# Patient Record
Sex: Male | Born: 1970
Health system: Southern US, Community
[De-identification: ages and names within clinical notes are randomized; demographics above are authoritative.]

## PROBLEM LIST (undated history)

## (undated) DIAGNOSIS — J309 Allergic rhinitis, unspecified: Secondary | ICD-10-CM

## (undated) DIAGNOSIS — J45909 Unspecified asthma, uncomplicated: Secondary | ICD-10-CM

## (undated) DIAGNOSIS — W5501XA Bitten by cat, initial encounter: Secondary | ICD-10-CM

## (undated) HISTORY — DX: Bitten by cat, initial encounter: W55.01XA

## (undated) HISTORY — PX: OTHER SURGICAL HISTORY: SHX169

## (undated) HISTORY — DX: Unspecified asthma, uncomplicated: J45.909

## (undated) HISTORY — DX: Allergic rhinitis, unspecified: J30.9

---

## 2000-01-12 ENCOUNTER — Ambulatory Visit: Admission: RE | Admit: 2000-01-12 | Discharge: 2000-01-12 | Payer: Self-pay | Admitting: Internal Medicine

## 2002-04-23 ENCOUNTER — Encounter: Payer: Self-pay | Admitting: Family Medicine

## 2002-04-23 ENCOUNTER — Ambulatory Visit (HOSPITAL_COMMUNITY): Admission: RE | Admit: 2002-04-23 | Discharge: 2002-04-23 | Payer: Self-pay | Admitting: Family Medicine

## 2009-01-26 ENCOUNTER — Encounter: Admission: RE | Admit: 2009-01-26 | Discharge: 2009-01-26 | Payer: Self-pay | Admitting: Family Medicine

## 2013-04-25 ENCOUNTER — Other Ambulatory Visit: Payer: Self-pay | Admitting: Family Medicine

## 2013-04-25 DIAGNOSIS — R109 Unspecified abdominal pain: Secondary | ICD-10-CM

## 2013-04-30 ENCOUNTER — Ambulatory Visit
Admission: RE | Admit: 2013-04-30 | Discharge: 2013-04-30 | Disposition: A | Discharge status: Home / Self Care | Payer: BC Managed Care – PPO | Source: Ambulatory Visit | Attending: Family Medicine | Admitting: Family Medicine

## 2013-04-30 DIAGNOSIS — R109 Unspecified abdominal pain: Secondary | ICD-10-CM

## 2015-09-08 DIAGNOSIS — S80861A Insect bite (nonvenomous), right lower leg, initial encounter: Secondary | ICD-10-CM | POA: Diagnosis not present

## 2015-09-08 DIAGNOSIS — W57XXXA Bitten or stung by nonvenomous insect and other nonvenomous arthropods, initial encounter: Secondary | ICD-10-CM | POA: Diagnosis not present

## 2015-09-08 DIAGNOSIS — M791 Myalgia: Secondary | ICD-10-CM | POA: Diagnosis not present

## 2015-09-08 DIAGNOSIS — J309 Allergic rhinitis, unspecified: Secondary | ICD-10-CM | POA: Diagnosis not present

## 2015-09-08 DIAGNOSIS — J029 Acute pharyngitis, unspecified: Secondary | ICD-10-CM | POA: Diagnosis not present

## 2015-09-08 DIAGNOSIS — R5383 Other fatigue: Secondary | ICD-10-CM | POA: Diagnosis not present

## 2015-09-08 MED FILL — DOXYCYCLINE HYCLATE 100 MG: 100 | 10 days supply | Qty: 20 | Fill #0

## 2015-11-03 MED FILL — AZITHROMYCIN 250 MG TABLET: 250 | 5 days supply | Qty: 6 | Fill #0

## 2016-04-21 MED FILL — AMOXICILLIN 500 MG CAPSULE: 500 | 10 days supply | Qty: 40 | Fill #0

## 2017-04-03 ENCOUNTER — Other Ambulatory Visit: Payer: Self-pay | Admitting: Family Medicine

## 2017-04-03 ENCOUNTER — Ambulatory Visit
Admission: RE | Admit: 2017-04-03 | Discharge: 2017-04-03 | Disposition: A | Discharge status: Home / Self Care | Payer: 59 | Source: Ambulatory Visit | Attending: Family Medicine | Admitting: Family Medicine

## 2017-04-03 DIAGNOSIS — K5900 Constipation, unspecified: Secondary | ICD-10-CM | POA: Diagnosis not present

## 2017-04-03 DIAGNOSIS — M47816 Spondylosis without myelopathy or radiculopathy, lumbar region: Secondary | ICD-10-CM | POA: Diagnosis not present

## 2017-04-03 DIAGNOSIS — M545 Low back pain: Secondary | ICD-10-CM

## 2017-04-03 DIAGNOSIS — R109 Unspecified abdominal pain: Secondary | ICD-10-CM

## 2017-04-03 DIAGNOSIS — N5089 Other specified disorders of the male genital organs: Secondary | ICD-10-CM | POA: Diagnosis not present

## 2017-04-06 ENCOUNTER — Other Ambulatory Visit: Payer: Self-pay | Admitting: Family Medicine

## 2017-04-06 ENCOUNTER — Encounter: Payer: Self-pay | Admitting: Gastroenterology

## 2017-04-06 DIAGNOSIS — N5089 Other specified disorders of the male genital organs: Secondary | ICD-10-CM

## 2017-04-13 ENCOUNTER — Ambulatory Visit
Admission: RE | Admit: 2017-04-13 | Discharge: 2017-04-13 | Disposition: A | Discharge status: Home / Self Care | Payer: 59 | Source: Ambulatory Visit | Attending: Family Medicine | Admitting: Family Medicine

## 2017-04-13 DIAGNOSIS — N5089 Other specified disorders of the male genital organs: Secondary | ICD-10-CM | POA: Diagnosis not present

## 2017-04-19 ENCOUNTER — Encounter: Payer: Self-pay | Admitting: Gastroenterology

## 2017-04-19 ENCOUNTER — Ambulatory Visit: Payer: 59 | Admitting: Gastroenterology

## 2017-04-19 VITALS — BP 112/68 | HR 84 | Ht 76.0 in | Wt 205.2 lb

## 2017-04-19 DIAGNOSIS — R194 Change in bowel habit: Secondary | ICD-10-CM

## 2017-04-19 DIAGNOSIS — K219 Gastro-esophageal reflux disease without esophagitis: Secondary | ICD-10-CM

## 2017-04-19 DIAGNOSIS — R1030 Lower abdominal pain, unspecified: Secondary | ICD-10-CM

## 2017-04-19 DIAGNOSIS — R140 Abdominal distension (gaseous): Secondary | ICD-10-CM

## 2017-04-19 MED ORDER — NA SULFATE-K SULFATE-MG SULF 17.5-3.13-1.6 GM/177ML PO SOLN: 1.0000 | ORAL | 0 refills | Status: DC

## 2017-04-19 NOTE — Patient Instructions (Signed)
You have been scheduled for a colonoscopy. Please follow written instructions given to you at your visit today.  Please pick up your prep supplies at the pharmacy within the next 1-3 days. If you use inhalers (even only as needed), please bring them with you on the day of your procedure. Your physician has requested that you go to www.startemmi.com and enter the access code given to you at your visit today. This web site gives a general overview about your procedure. However, you should still follow specific instructions given to you by our office regarding your preparation for the procedure.  Daily powder fiber such as Citrucel or Benefiber.  Please purchase the following medications over the counter and take as directed: Zantac 150 mg daily.

## 2017-04-19 NOTE — Progress Notes (Signed)
04/19/2017 THEODUS RAN [id number] [birth year]   HISTORY OF PRESENT ILLNESS:  This is a pleasant 47 year old male who is new to our office.  Referred here by his PCP, Dr. Moreen Fowler, for evaluation regarding abdominal pain, bloating, and change in bowel habits.  He reports generalized/lower abdominal discomfort and distention/bloating that has been an issue for at least the past year.  Says that he used to have good bowel movements but now they are different sizes and shapes, sometimes narrow in caliber.  A lot of times does not feel like he completely evacuates.  Complains of low back pain.  Says that he constantly feels bloated or full.  Says that for some reason he seems to feel more bloated in the shower.  Denies weight loss.  Sees occasional small amounts of bright red blood on the toilet paper when he wipes, but no blood in the stool.  Was checked for celiac disease in 2013 and that was negative but he tells me that a couple of years ago he went gluten free.  About 3-4 months ago started eating some occasional gluten again.    He also complains of some reflux with irritation and sore throat.  Not on any medications for this issue.  Takes 3-4 Tums prn and that seems to help.  Since he has stopped drinking coffee he has not even really needed any Tums.  Recent CBC and CMP were normal.  Had a normal abdominal x-ray.   Past Medical History:  Diagnosis Date  . Allergic rhinitis   . Asthma   . Cat bite    History reviewed. No pertinent surgical history.  reports that  has never smoked. he has never used smokeless tobacco. He reports that he drinks alcohol. He reports that he does not use drugs. family history includes CVA in his maternal grandmother; Cancer in his paternal grandfather; Diabetes in his father and maternal grandmother; Hypertension in his father; Multiple myeloma in his father; Prostate cancer in his maternal grandfather. No Known Allergies    Outpatient Encounter  Medications as of 04/19/2017  Medication Sig  . albuterol (PROAIR HFA) 108 (90 Base) MCG/ACT inhaler Inhale 1-2 puffs into the lungs every 6 (six) hours as needed for wheezing or shortness of breath.  . calcium carbonate (TUMS - DOSED IN MG ELEMENTAL CALCIUM) 500 MG chewable tablet Chew 1 tablet by mouth daily.  . cetirizine (ZYRTEC) 5 MG tablet Take 5 mg by mouth daily.   No facility-administered encounter medications on file as of 04/19/2017.      REVIEW OF SYSTEMS  : All other systems reviewed and negative except where noted in the History of Present Illness.   PHYSICAL EXAM: BP 112/68   Pulse 84   Ht '6\' 4"'$  (1.93 m)   Wt 205 lb 3.2 oz (93.1 kg)   BMI 24.98 kg/m  General: Well developed white male in no acute distress Head: Normocephalic and atraumatic Eyes:  Sclerae anicteric, conjunctiva pink. Ears: Normal auditory acuity Lungs: Clear throughout to auscultation; no increased WOB. Heart: Regular rate and rhythm; no M/R/G. Abdomen: Soft, non-distended.  BS present.  Non-tender. Rectal:  Will be done at the time of colonoscopy. Musculoskeletal: Symmetrical with no gross deformities  Skin: No lesions on visible extremities Extremities: No edema  Neurological: Alert oriented x 4, grossly non-focal Psychological:  Alert and cooperative. Normal mood and affect  ASSESSMENT AND PLAN: *Change in bowel habits, bloating, lower abdominal pain:  Will schedule for colonoscopy with  Dr. Silverio Decamp.  Will have him begin taking a daily powder fiber supplement such as Benefiber or Citrucel. *GERD:  Not currently on any medication.  Will just start with zantac 150 mg daily.  **The risks, benefits, and alternatives to colonoscopy were discussed with the patient and he consents to proceed.    CC:  Donald Prose, MD CC:  Dr. Moreen Fowler

## 2017-04-23 ENCOUNTER — Encounter: Payer: Self-pay | Admitting: Gastroenterology

## 2017-04-24 DIAGNOSIS — K219 Gastro-esophageal reflux disease without esophagitis: Secondary | ICD-10-CM | POA: Insufficient documentation

## 2017-04-24 DIAGNOSIS — R1030 Lower abdominal pain, unspecified: Secondary | ICD-10-CM | POA: Insufficient documentation

## 2017-04-24 DIAGNOSIS — R140 Abdominal distension (gaseous): Secondary | ICD-10-CM | POA: Insufficient documentation

## 2017-04-24 DIAGNOSIS — R194 Change in bowel habit: Secondary | ICD-10-CM | POA: Insufficient documentation

## 2017-04-27 NOTE — Progress Notes (Signed)
Reviewed and agree with documentation and assessment and plan. K. Veena Nandigam , MD   

## 2017-05-21 ENCOUNTER — Encounter: Payer: Self-pay | Admitting: Gastroenterology

## 2017-05-22 MED FILL — SUPREP BOWEL PREP KIT: 17.5-3.13-1 | 1 days supply | Qty: 354 | Fill #0

## 2017-05-29 ENCOUNTER — Other Ambulatory Visit: Payer: Self-pay

## 2017-05-29 ENCOUNTER — Encounter: Payer: Self-pay | Admitting: Gastroenterology

## 2017-05-29 ENCOUNTER — Ambulatory Visit (AMBULATORY_SURGERY_CENTER): Payer: 59 | Admitting: Gastroenterology

## 2017-05-29 VITALS — BP 112/72 | HR 67 | Temp 98.8°F | Resp 27 | Ht 76.0 in | Wt 205.0 lb

## 2017-05-29 DIAGNOSIS — D128 Benign neoplasm of rectum: Secondary | ICD-10-CM

## 2017-05-29 DIAGNOSIS — R194 Change in bowel habit: Secondary | ICD-10-CM | POA: Diagnosis present

## 2017-05-29 DIAGNOSIS — K621 Rectal polyp: Secondary | ICD-10-CM | POA: Diagnosis not present

## 2017-05-29 DIAGNOSIS — D129 Benign neoplasm of anus and anal canal: Secondary | ICD-10-CM

## 2017-05-29 DIAGNOSIS — R109 Unspecified abdominal pain: Secondary | ICD-10-CM | POA: Diagnosis not present

## 2017-05-29 MED ORDER — SODIUM CHLORIDE 0.9 % IV SOLN: 500.0000 mL | Freq: Once | INTRAVENOUS | Status: DC

## 2017-05-29 NOTE — Op Note (Signed)
[facility] Patient Name: Frank [name] Procedure Date: 05/29/2017 3:01 PM MRN: [id number] Endoscopist: Mauri Pole , MD Age: 47 Referring MD:  Date of Birth: November 15, 1970 Gender: Male Account #: [id number] Procedure:                Colonoscopy Indications:              Change in bowel habits Medicines:                Monitored Anesthesia Care Procedure:                Pre-Anesthesia Assessment:                           - Prior to the procedure, a History and Physical                            was performed, and patient medications and                            allergies were reviewed. The patient's tolerance of                            previous anesthesia was also reviewed. The risks                            and benefits of the procedure and the sedation                            options and risks were discussed with the patient.                            All questions were answered, and informed consent                            was obtained. Prior Anticoagulants: The patient has                            taken no previous anticoagulant or antiplatelet                            agents. ASA Grade Assessment: II - A patient with                            mild systemic disease. After reviewing the risks                            and benefits, the patient was deemed in                            satisfactory condition to undergo the procedure.                           After obtaining informed consent, the colonoscope  was passed under direct vision. Throughout the                            procedure, the patient's blood pressure, pulse, and                            oxygen saturations were monitored continuously. The                            Colonoscope was introduced through the anus and                            advanced to the the cecum, identified by                            appendiceal orifice and ileocecal valve.  The                            colonoscopy was performed without difficulty. The                            patient tolerated the procedure well. The quality                            of the bowel preparation was excellent. The                            ileocecal valve, appendiceal orifice, and rectum                            were photographed. Scope In: 3:04:10 PM Scope Out: 3:25:21 PM Scope Withdrawal Time: 0 hours 14 minutes 26 seconds  Total Procedure Duration: 0 hours 21 minutes 11 seconds  Findings:                 The perianal and digital rectal examinations were                            normal.                           A 5 mm polyp was found in the rectum. The polyp was                            sessile. The polyp was removed with a cold snare.                            Resection and retrieval were complete.                           Multiple small and large-mouthed diverticula were                            found in the sigmoid colon and descending colon.  Non-bleeding internal hemorrhoids were found during                            retroflexion. The hemorrhoids were small. Complications:            No immediate complications. Estimated Blood Loss:     Estimated blood loss was minimal. Impression:               - One 5 mm polyp in the rectum, removed with a cold                            snare. Resected and retrieved.                           - Moderate diverticulosis in the sigmoid colon and                            in the descending colon.                           - Non-bleeding internal hemorrhoids. Recommendation:           - Patient has a contact number available for                            emergencies. The signs and symptoms of potential                            delayed complications were discussed with the                            patient. Return to normal activities tomorrow.                            Written discharge  instructions were provided to the                            patient.                           - Resume previous diet.                           - Continue present medications.                           - Await pathology results.                           - Repeat colonoscopy in 5-10 years for surveillance                            based on pathology results. Mauri Pole, MD 05/29/2017 3:35:32 PM This report has been signed electronically.

## 2017-05-29 NOTE — Progress Notes (Signed)
To PACU, VSS. Report to [email or web address] 

## 2017-05-29 NOTE — Patient Instructions (Signed)
YOU HAD AN ENDOSCOPIC PROCEDURE TODAY AT THE [facility]:   Refer to the procedure report that was given to you for any specific questions about what was found during the examination.  If the procedure report does not answer your questions, please call your gastroenterologist to clarify.  If you requested that your care partner not be given the details of your procedure findings, then the procedure report has been included in a sealed envelope for you to review at your convenience later.  YOU SHOULD EXPECT: Some feelings of bloating in the abdomen. Passage of more gas than usual.  Walking can help get rid of the air that was put into your GI tract during the procedure and reduce the bloating. If you had a lower endoscopy (such as a colonoscopy or flexible sigmoidoscopy) you may notice spotting of blood in your stool or on the toilet paper. If you underwent a bowel prep for your procedure, you may not have a normal bowel movement for a few days.  Please Note:  You might notice some irritation and congestion in your nose or some drainage.  This is from the oxygen used during your procedure.  There is no need for concern and it should clear up in a day or so.  SYMPTOMS TO REPORT IMMEDIATELY:   Following lower endoscopy (colonoscopy or flexible sigmoidoscopy):  Excessive amounts of blood in the stool  Significant tenderness or worsening of abdominal pains  Swelling of the abdomen that is new, acute  Fever of 100F or higher   For urgent or emergent issues, a gastroenterologist can be reached at any hour by calling [phone].   DIET:  We do recommend a small meal at first, but then you may proceed to your regular diet.  Drink plenty of fluids but you should avoid alcoholic beverages for 24 hours. Try to increase the fiber in your diet, and drink plenty of water.  ACTIVITY:  You should plan to take it easy for the rest of today and you should NOT DRIVE or use heavy machinery until  tomorrow (because of the sedation medicines used during the test).    FOLLOW UP: Our staff will call the number listed on your records the next business day following your procedure to check on you and address any questions or concerns that you may have regarding the information given to you following your procedure. If we do not reach you, we will leave a message.  However, if you are feeling well and you are not experiencing any problems, there is no need to return our call.  We will assume that you have returned to your regular daily activities without incident.  If any biopsies were taken you will be contacted by phone or by letter within the next 1-3 weeks.  Please call us at [phone] if you have not heard about the biopsies in 3 weeks.    SIGNATURES/CONFIDENTIALITY: You and/or your care partner have signed paperwork which will be entered into your electronic medical record.  These signatures attest to the fact that that the information above on your After Visit Summary has been reviewed and is understood.  Full responsibility of the confidentiality of this discharge information lies with you and/or your care-partner.  Read all handouts given to you by your recovery room nurse. 

## 2017-05-29 NOTE — Progress Notes (Signed)
Pt's states no medical or surgical changes since previsit or office visit. 

## 2017-05-29 NOTE — Progress Notes (Signed)
Called to room to assist during endoscopic procedure.  Patient ID and intended procedure confirmed with present staff. Received instructions for my participation in the procedure from the performing physician.  

## 2017-05-30 ENCOUNTER — Telehealth: Payer: Self-pay

## 2017-05-30 NOTE — Telephone Encounter (Signed)
  Follow up Call-  Call back number 05/29/2017  Post procedure Call Back phone  # [phone]  Permission to leave phone message Yes  Some recent data might be hidden     Patient questions:  Do you have a fever, pain , or abdominal swelling? No. Pain Score  0 *  Have you tolerated food without any problems? Yes.    Have you been able to return to your normal activities? Yes.    Do you have any questions about your discharge instructions: Diet   No. Medications  No. Follow up visit  No.  Do you have questions or concerns about your Care? No.  Actions: * If pain score is 4 or above: No action needed, pain <4.

## 2017-06-10 ENCOUNTER — Encounter: Payer: Self-pay | Admitting: Gastroenterology

## 2017-06-21 DIAGNOSIS — J0100 Acute maxillary sinusitis, unspecified: Secondary | ICD-10-CM | POA: Diagnosis not present

## 2017-06-21 MED FILL — AMOXICILLIN 875 MG TABLET: 875 | 10 days supply | Qty: 20 | Fill #0

## 2017-07-04 DIAGNOSIS — H524 Presbyopia: Secondary | ICD-10-CM | POA: Diagnosis not present

## 2017-07-04 DIAGNOSIS — H52223 Regular astigmatism, bilateral: Secondary | ICD-10-CM | POA: Diagnosis not present

## 2018-02-19 MED FILL — AZITHROMYCIN 250 MG TABLET: 250 | 5 days supply | Qty: 6 | Fill #0

## 2018-04-22 IMAGING — US US SCROTUM W/ DOPPLER COMPLETE
1 series · 14 of 25 positions shown · non-contrast
Comparison: None.

CLINICAL DATA: Area of palpable concern in the left testicle.

EXAM:
SCROTAL ULTRASOUND
DOPPLER ULTRASOUND OF THE TESTICLES
TECHNIQUE: Complete ultrasound examination of the testicles, epididymis, and
other scrotal structures was performed. Color and spectral Doppler
ultrasound were also utilized to evaluate blood flow to the
testicles.

[Series 1: us scrotum w/ doppler complete · 0.08mm/px · 14 of 42 slices shown]
[im 1/42]
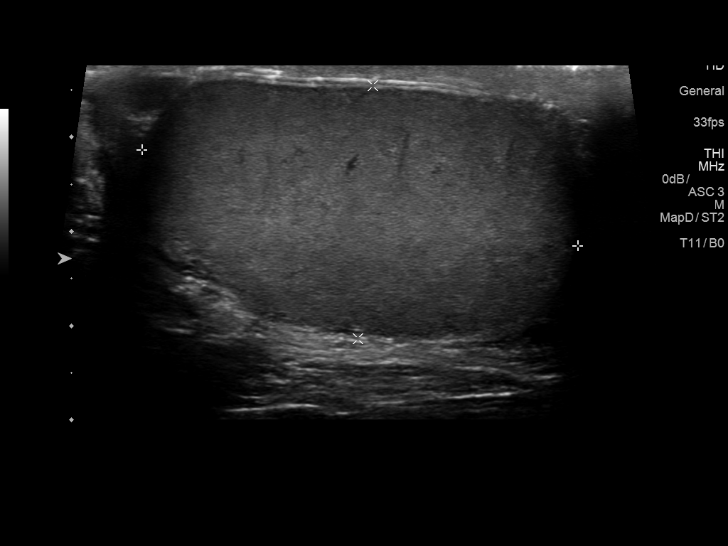
[im 4/42]
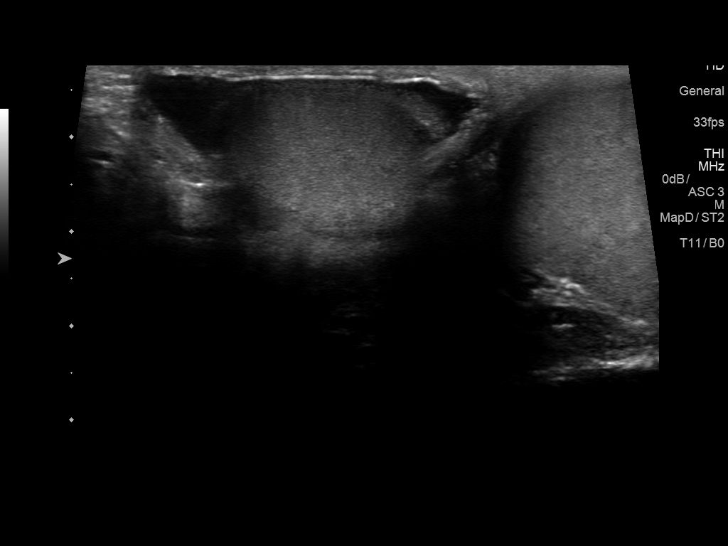
[im 7/42]
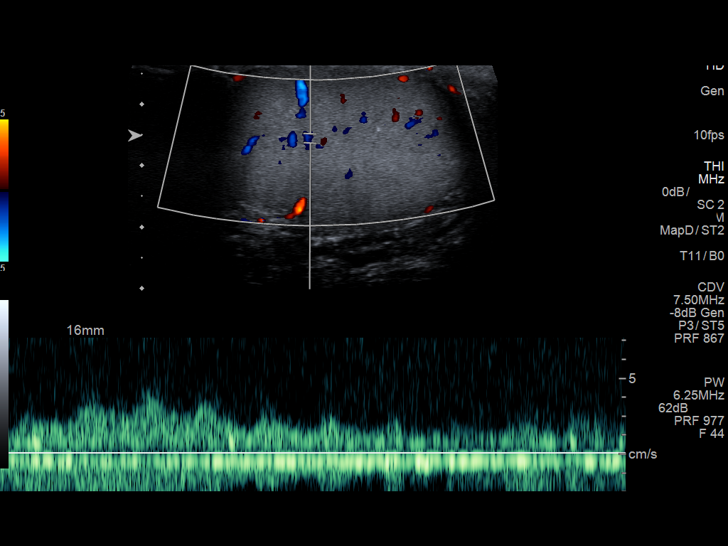
[im 11/42]
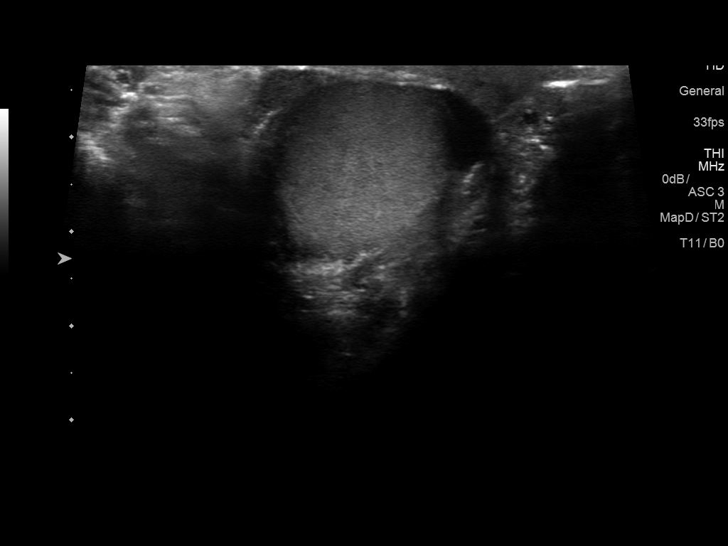
[im 14/42]
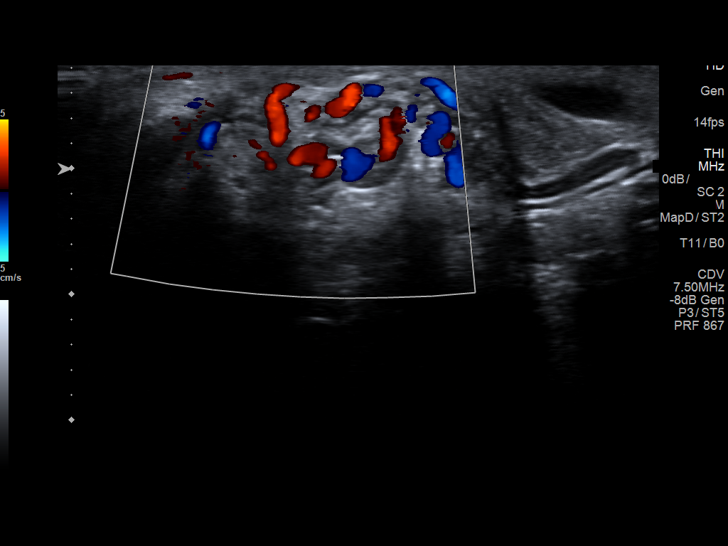
[im 16/42]
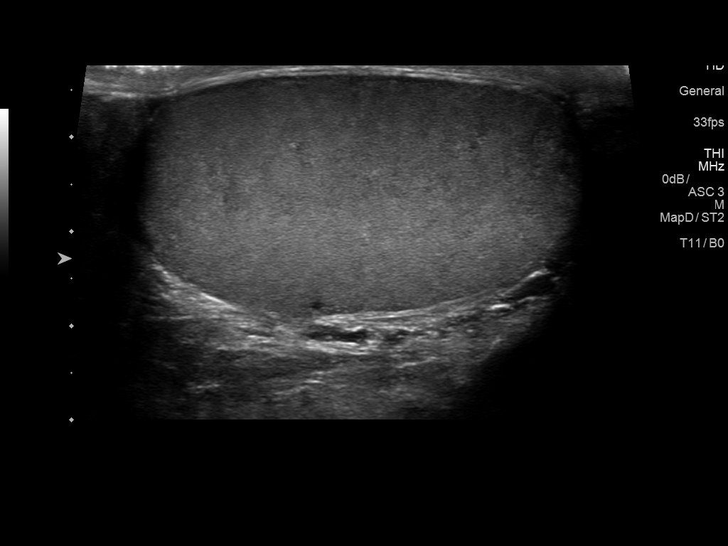
[im 19/42]
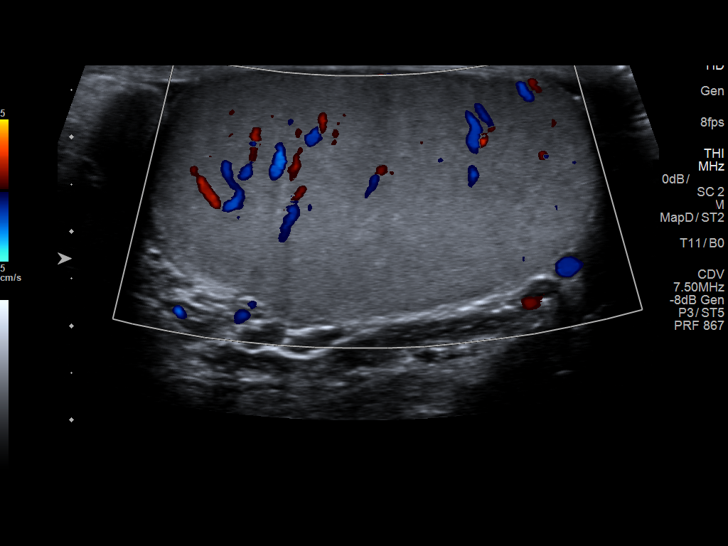
[im 23/42]
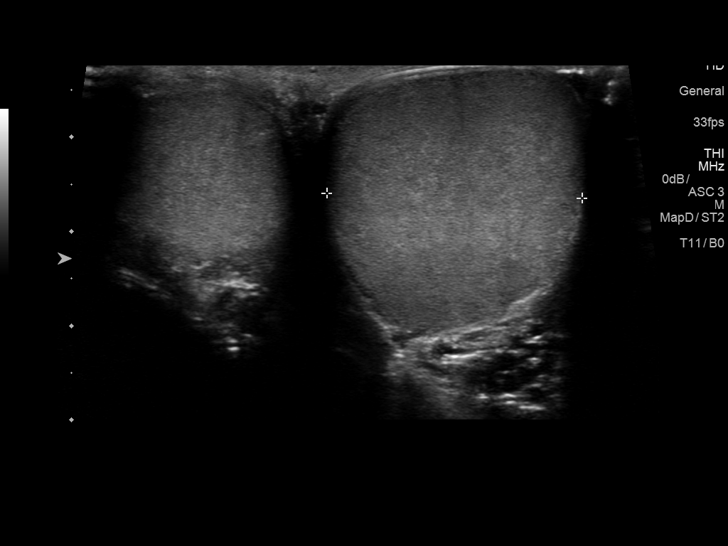
[im 26/42]
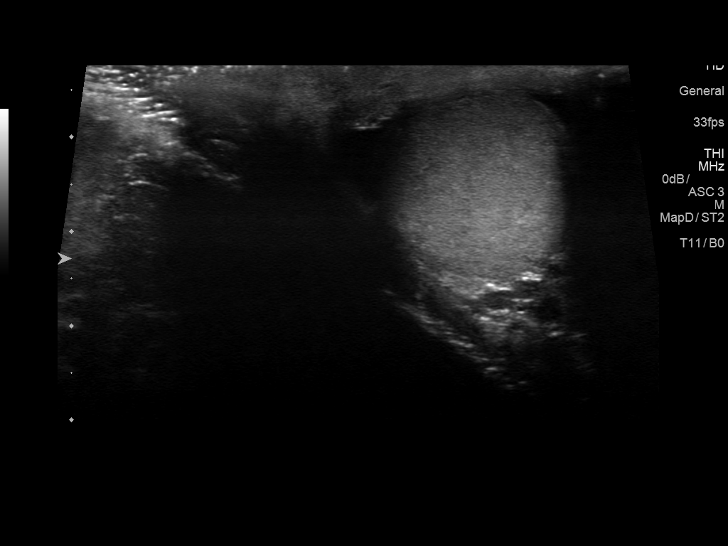
[im 28/42]
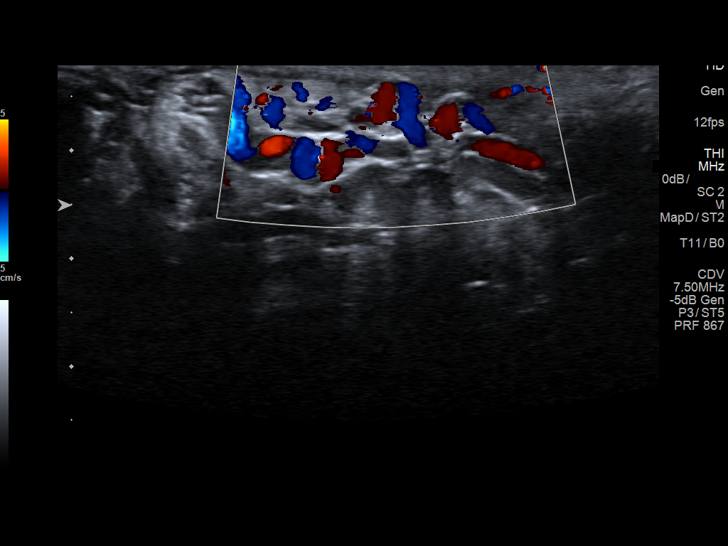
[im 31/42]
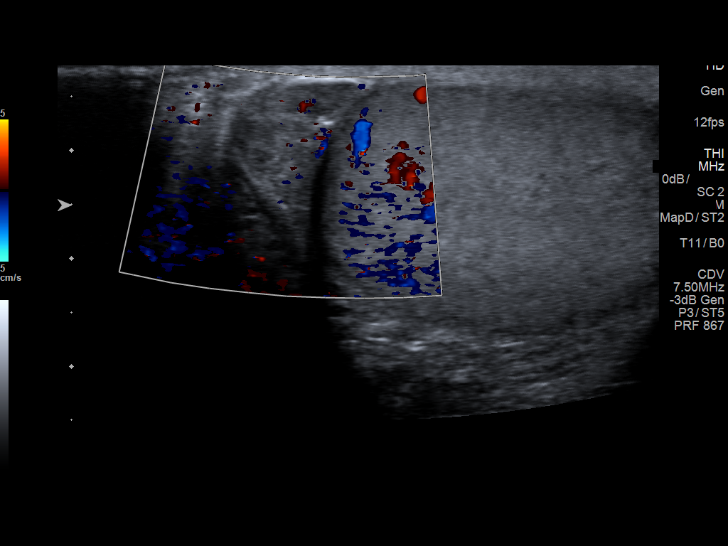
[im 35/42]
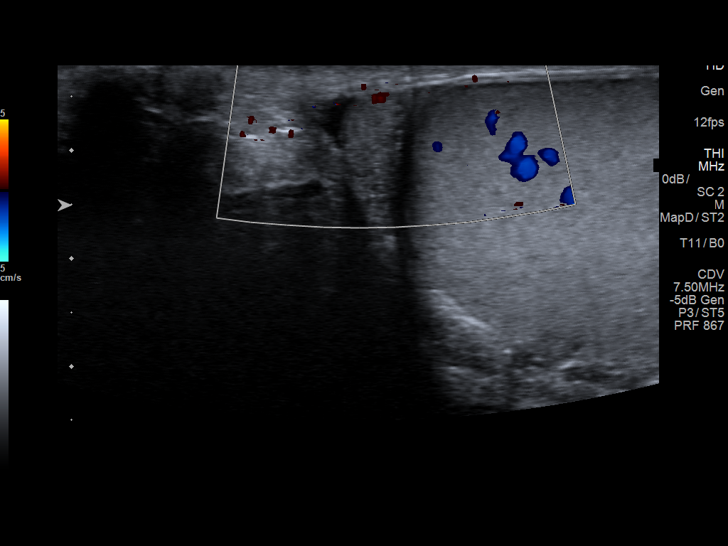
[im 38/42]
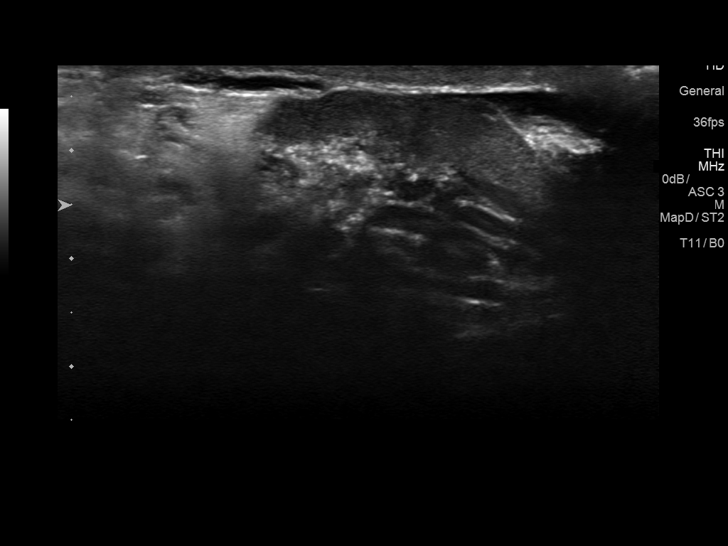
[im 42/42]
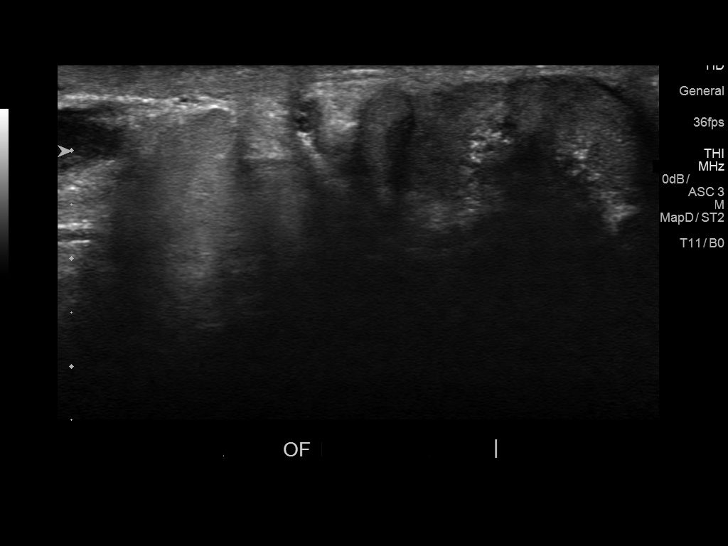

[14 of 25 positions shown; findings below may reference images not displayed]

FINDINGS: Right testicle

Measurements: 4.7 x 2.7 x 3.0 cm. No mass or microlithiasis
visualized.

Left testicle

Measurements: 4.7 x 2.6 x 2.7 cm. No mass or microlithiasis
visualized.

Right epididymis:  Normal in size and appearance.

Left epididymis:  Normal in size and appearance.

Hydrocele:  None visualized.

Varicocele:  None visualized.

Pulsed Doppler interrogation of both testes demonstrates normal low
resistance arterial and venous waveforms bilaterally.
IMPRESSION: Normal testicular ultrasound.

## 2018-05-29 DIAGNOSIS — Z13220 Encounter for screening for lipoid disorders: Secondary | ICD-10-CM | POA: Diagnosis not present

## 2018-05-29 DIAGNOSIS — Z111 Encounter for screening for respiratory tuberculosis: Secondary | ICD-10-CM | POA: Diagnosis not present

## 2018-05-29 DIAGNOSIS — Z83438 Family history of other disorder of lipoprotein metabolism and other lipidemia: Secondary | ICD-10-CM | POA: Diagnosis not present

## 2018-05-29 DIAGNOSIS — Z125 Encounter for screening for malignant neoplasm of prostate: Secondary | ICD-10-CM | POA: Diagnosis not present

## 2018-05-29 DIAGNOSIS — L723 Sebaceous cyst: Secondary | ICD-10-CM | POA: Diagnosis not present

## 2018-05-29 DIAGNOSIS — Z0000 Encounter for general adult medical examination without abnormal findings: Secondary | ICD-10-CM | POA: Diagnosis not present

## 2019-07-29 DIAGNOSIS — L821 Other seborrheic keratosis: Secondary | ICD-10-CM | POA: Diagnosis not present

## 2019-07-29 DIAGNOSIS — D485 Neoplasm of uncertain behavior of skin: Secondary | ICD-10-CM | POA: Diagnosis not present

## 2019-07-29 DIAGNOSIS — L578 Other skin changes due to chronic exposure to nonionizing radiation: Secondary | ICD-10-CM | POA: Diagnosis not present

## 2019-07-29 DIAGNOSIS — Z86018 Personal history of other benign neoplasm: Secondary | ICD-10-CM | POA: Diagnosis not present

## 2019-07-29 DIAGNOSIS — D225 Melanocytic nevi of trunk: Secondary | ICD-10-CM | POA: Diagnosis not present

## 2019-07-29 DIAGNOSIS — L723 Sebaceous cyst: Secondary | ICD-10-CM | POA: Diagnosis not present

## 2019-07-29 DIAGNOSIS — L814 Other melanin hyperpigmentation: Secondary | ICD-10-CM | POA: Diagnosis not present

## 2019-09-24 ENCOUNTER — Institutional Professional Consult (permissible substitution): Payer: 59 | Admitting: Plastic Surgery

## 2019-11-13 ENCOUNTER — Ambulatory Visit: Payer: 59 | Admitting: Plastic Surgery

## 2019-11-13 ENCOUNTER — Encounter: Payer: Self-pay | Admitting: Plastic Surgery

## 2019-11-13 ENCOUNTER — Other Ambulatory Visit: Payer: Self-pay

## 2019-11-13 VITALS — BP 137/78 | HR 77 | Temp 98.2°F | Ht 76.0 in | Wt 217.2 lb

## 2019-11-13 DIAGNOSIS — L989 Disorder of the skin and subcutaneous tissue, unspecified: Secondary | ICD-10-CM | POA: Diagnosis not present

## 2019-11-13 NOTE — Progress Notes (Signed)
   Referring Provider Antony Contras, MD [address],  [place] [id number]   CC:  Chief Complaint  Patient presents with  . Advice Only      Frank [name] is an 49 y.o. male.  HPI: Patient presents to discuss a cyst on his right scalp.  He also has a lesion on his right upper eyelid that is changing as well.  The cyst on his right scalp has been present for a number of years and is gradually growing.  It is bothersome to him and is intermittently painful.  Similarly the lesion on his right upper lid is becoming more prominent and changing in size.  He would like to have them both removed surgically.  No Known Allergies  Outpatient Encounter Medications as of 11/13/2019  Medication Sig  . albuterol (PROAIR HFA) 108 (90 Base) MCG/ACT inhaler Inhale 1-2 puffs into the lungs every 6 (six) hours as needed for wheezing or shortness of breath.  . calcium carbonate (TUMS - DOSED IN MG ELEMENTAL CALCIUM) 500 MG chewable tablet [name] 1 tablet by mouth daily.  . cetirizine (ZYRTEC) 5 MG tablet Take 5 mg by mouth daily.  Marland Kitchen ibuprofen (ADVIL,MOTRIN) 200 MG tablet Take 200 mg by mouth every 6 (six) hours as needed.   Facility-Administered Encounter Medications as of 11/13/2019  Medication  . 0.9 %  sodium chloride infusion     Past Medical History:  Diagnosis Date  . Allergic rhinitis   . Asthma   . Cat bite     No past surgical history on file.  Family History  Problem Relation Age of Onset  . Multiple myeloma Father   . Diabetes Father   . Hypertension Father   . Diabetes Maternal Grandmother   . CVA Maternal Grandmother   . Prostate cancer Maternal Grandfather   . Cancer Paternal Grandfather   . Colon cancer Neg Hx     Social History   Social History Narrative  . Not on file     Review of Systems General: Denies fevers, chills, weight loss CV: Denies chest pain, shortness of breath, palpitations  Physical Exam Vitals with BMI 11/13/2019 05/29/2017  05/29/2017  Height $Remov'6\' 4"'DpCaoi$  - -  Weight 217 lbs 3 oz - -  BMI 40.97 - -  Systolic 353 299 99  Diastolic 78 72 67  Pulse 77 67 -    General:  No acute distress,  Alert and oriented, Non-Toxic, Normal speech and affect Examination shows a 3 cm cystic lesion in the right scalp.  This is in the [employer] area.  Overlying skin is mildly erythematous but there is no fluctuance or drainage.  Right upper lid shows a 5 mm raised mildly pigmented lesion with well-defined borders.  Assessment/Plan Patient presents with cystic lesion of the scalp and a skin lesion on the right upper lid.  I recommended excision of the scalp lesion and a shave excision of the upper lid lesion.  I discussed the risk that include bleeding, infection, demonstrating structures, need for additional procedures.  I discussed the expected postoperative course.  We will plan to schedule this to be done under local.  Cindra Presume 11/13/2019, 11:44 AM

## 2019-12-15 DIAGNOSIS — H524 Presbyopia: Secondary | ICD-10-CM | POA: Diagnosis not present

## 2019-12-16 ENCOUNTER — Telehealth: Payer: Self-pay | Admitting: Plastic Surgery

## 2019-12-16 NOTE — Telephone Encounter (Signed)
Patient called to advise he didn't want the the shave biopsy on his eyelid. He would like only the cyst removed.

## 2019-12-18 ENCOUNTER — Other Ambulatory Visit (HOSPITAL_COMMUNITY)
Admission: RE | Admit: 2019-12-18 | Discharge: 2019-12-18 | Disposition: A | Discharge status: Home / Self Care | Payer: 59 | Source: Ambulatory Visit | Attending: Plastic Surgery | Admitting: Plastic Surgery

## 2019-12-18 ENCOUNTER — Ambulatory Visit (INDEPENDENT_AMBULATORY_CARE_PROVIDER_SITE_OTHER): Payer: 59 | Admitting: Plastic Surgery

## 2019-12-18 ENCOUNTER — Other Ambulatory Visit: Payer: Self-pay

## 2019-12-18 ENCOUNTER — Encounter: Payer: Self-pay | Admitting: Plastic Surgery

## 2019-12-18 VITALS — BP 130/80 | HR 85 | Temp 97.1°F | Wt 215.0 lb

## 2019-12-18 DIAGNOSIS — L720 Epidermal cyst: Secondary | ICD-10-CM | POA: Diagnosis not present

## 2019-12-18 DIAGNOSIS — L989 Disorder of the skin and subcutaneous tissue, unspecified: Secondary | ICD-10-CM | POA: Insufficient documentation

## 2019-12-18 NOTE — Progress Notes (Signed)
Operative Note   DATE OF OPERATION: 12/18/2019  LOCATION:    SURGICAL DEPARTMENT: Plastic Surgery  PREOPERATIVE DIAGNOSES: Scalp cyst  POSTOPERATIVE DIAGNOSES:  same  PROCEDURE:  1. Excision of scalp cyst measuring 3 cm 2. Complex closure measuring 3 cm  SURGEON: Talmadge Coventry, MD  ANESTHESIA:  Local  COMPLICATIONS: None.   INDICATIONS FOR PROCEDURE:  The patient, Frank [name] is a 49 y.o. male born on [birth year], is here for treatment of scalp cyst MRN: [id number]  CONSENT:  Informed consent was obtained directly from the patient. Risks, benefits and alternatives were fully discussed. Specific risks including but not limited to bleeding, infection, hematoma, seroma, scarring, pain, infection, wound healing problems, and need for further surgery were all discussed. The patient did have an ample opportunity to have questions answered to satisfaction.   DESCRIPTION OF PROCEDURE:  Local anesthesia was administered. The patient's operative site was prepped and draped in a sterile fashion. A time out was performed and all information was confirmed to be correct.  The lesion was excised with a 15 blade.  Hemostasis was obtained.  Circumferential undermining was performed and the skin was advanced and closed in layers with interrupted buried Monocryl sutures and 4-0 Monocryl for the skin.  The lesion excised measured 3 cm, and the total length of closure measured 3 cm.    The patient tolerated the procedure well.  There were no complications.

## 2019-12-22 LAB — SURGICAL PATHOLOGY

## 2020-02-19 DIAGNOSIS — R030 Elevated blood-pressure reading, without diagnosis of hypertension: Secondary | ICD-10-CM | POA: Diagnosis not present

## 2020-02-19 DIAGNOSIS — Z111 Encounter for screening for respiratory tuberculosis: Secondary | ICD-10-CM | POA: Diagnosis not present

## 2020-02-19 DIAGNOSIS — Z833 Family history of diabetes mellitus: Secondary | ICD-10-CM | POA: Diagnosis not present

## 2020-03-29 ENCOUNTER — Telehealth: Payer: Self-pay | Admitting: Unknown Physician Specialty

## 2020-03-29 NOTE — Telephone Encounter (Signed)
Called to discuss with patient about COVID-19 symptoms and the use of one of the available treatments for those with mild to moderate Covid symptoms and at a high risk of hospitalization.  Pt appears to qualify for outpatient treatment due to co-morbid conditions and/or a member of an at-risk group in accordance with the FDA Emergency Use Authorization.    Symptom onset: ? Vaccinad: ?  Booster?  Qualifiers: ?  Unable to reach pt - Unable to leave message.  Left mychart  [name]

## 2020-03-30 ENCOUNTER — Encounter: Payer: Self-pay | Admitting: Unknown Physician Specialty

## 2020-03-30 ENCOUNTER — Telehealth: Payer: Self-pay | Admitting: Unknown Physician Specialty

## 2020-03-30 ENCOUNTER — Other Ambulatory Visit (HOSPITAL_COMMUNITY): Payer: Self-pay | Admitting: Unknown Physician Specialty

## 2020-03-30 MED ORDER — NIRMATRELVIR/RITONAVIR (PAXLOVID)TABLET: 3.0000 | ORAL_TABLET | Freq: Two times a day (BID) | ORAL | 0 refills | Status: DC

## 2020-03-30 NOTE — Telephone Encounter (Signed)
Outpatient Oral COVID Treatment Note  I connected with Frank [name] on 03/30/2020/4:38 PM by telephone and verified that I am speaking with the correct person using two identifiers.  I discussed the limitations, risks, security, and privacy concerns of performing an evaluation and management service by telephone and the availability of in person appointments. I also discussed with the patient that there may be a patient responsible charge related to this service. The patient expressed understanding and agreed to proceed.  Patient location: home Provider location: home  Diagnosis: COVID-19 infection  Purpose of visit: Discussion of potential use of Molnupiravir or Paxlovid, a new treatment for mild to moderate COVID-19 viral infection in non-hospitalized patients.   Subjective: Patient is a 50 y.o. male who has been diagnosed with COVID 19 viral infection.  Their symptoms began on 1/08 with stuffy nose.    Past Medical History:  Diagnosis Date  . Allergic rhinitis   . Asthma   . Cat bite     No Known Allergies   Current Outpatient Medications:  .  albuterol (PROAIR HFA) 108 (90 Base) MCG/ACT inhaler, Inhale 1-2 puffs into the lungs every 6 (six) hours as needed for wheezing or shortness of breath., Disp: , Rfl:  .  calcium carbonate (TUMS - DOSED IN MG ELEMENTAL CALCIUM) 500 MG chewable tablet, Chew 1 tablet by mouth daily., Disp: , Rfl:  .  cetirizine (ZYRTEC) 5 MG tablet, Take 5 mg by mouth daily., Disp: , Rfl:  .  ibuprofen (ADVIL,MOTRIN) 200 MG tablet, Take 200 mg by mouth every 6 (six) hours as needed., Disp: , Rfl:   Current Facility-Administered Medications:  .  0.9 %  sodium chloride infusion, 500 mL, Intravenous, Once, Nandigam, Venia Minks, MD  Objective: Patient appears/sounds well.  They are in no apparent distress.  Breathing is non labored.  Mood and behavior are normal.  Laboratory Data:  Creat from outside provider 12/2019 with creat clr of 126  Assessment: 50  y.o. male with mild/moderate COVID 19 viral infection diagnosed on 1/9 at high risk for progression to severe COVID 19.  Plan:  This patient is a 50 y.o. male that meets the following criteria for Emergency Use Authorization of: Paxlovid 1. Age >12 yr AND > 40 kg 2. SARS-COV-2 positive test 3. Symptom onset < 5 days 4. Mild-to-moderate COVID disease with high risk for severe progression to hospitalization or death  I have spoken and communicated the following to the patient or parent/caregiver regarding: 1. Paxlovid is an unapproved drug that is authorized for use under an Emergency Use Authorization.  2. There are no adequate, approved, available products for the treatment of COVID-19 in adults who have mild-to-moderate COVID-19 and are at high risk for progressing to severe COVID-19, including hospitalization or death. 3. Other therapeutics are currently authorized. For additional information on all products authorized for treatment or prevention of COVID-19, please see [email or web address].  4. There are benefits and risks of taking this treatment as outlined in the "Fact Sheet for Patients and Caregivers."  5. "Fact Sheet for Patients and Caregivers" was reviewed with patient. A hard copy will be provided to patient from pharmacy prior to the patient receiving treatment. 6. Patients should continue to self-isolate and use infection control measures (e.g., wear mask, isolate, social distance, avoid sharing personal items, clean and disinfect "high touch" surfaces, and frequent handwashing) according to CDC guidelines.  7. The patient or parent/caregiver has the option to accept or refuse treatment. 8. Patient medication history was reviewed  for potential drug interactions:No drug interactions 9. Patient's creatinine clearance was calculated to be 126, and they were therefore prescribed Normal  dose (CrCl>60) - nirmatrelvir 150mg  tab (2 tablet) by mouth twice daily AND ritonavir 100mg  tab (1 tablet) by mouth twice daily   After reviewing above information with the patient, the patient agrees to receive Paxlovid.  Follow up instructions:    . Take prescription BID x 5 days as directed . Reach out to pharmacist for counseling on medication if desired . For concerns regarding further COVID symptoms please follow up with your PCP or urgent care . For urgent or life-threatening issues, seek care at your local emergency department  The patient was provided an opportunity to ask questions, and all were answered. The patient agreed with the plan and demonstrated an understanding of the instructions.   Script sent to [facility] and opted to pick up RX.  The patient was advised to call their PCP or seek an in-person evaluation if the symptoms worsen or if the condition fails to improve as anticipated.   I provided 20 minutes of non face-to-face telephone visit time during this encounter, and > 50% was spent counseling as documented under my assessment & plan.  [name], NP 03/30/2020 /4:38 PM

## 2020-03-31 ENCOUNTER — Telehealth (HOSPITAL_COMMUNITY): Payer: Self-pay | Admitting: Pharmacist

## 2020-03-31 NOTE — Addendum Note (Signed)
Addended by: Loel Dubonnet on: 03/31/2020 09:45 AM   Modules accepted: Orders

## 2020-03-31 NOTE — Telephone Encounter (Signed)
Called to discuss with patient. No answer, left VM that we will remove the medication from his list and that his last day to start therapy would be tomorrow and he may call if he changes his mind. Will let pharmacy know to release medication to be used for other patients. Will send [name] a MyChart message with this information as well.   Frank Dubonnet, NP

## 2020-03-31 NOTE — Telephone Encounter (Signed)
Patient was prescribed oral covid treatment paxlovid and treatment note was reviewed. Medication has been received by [name] and reviewed for appropriateness.  Drug Interactions or Dosage Adjustments Noted: Nonr  Delivery Method: Patient states he is feeling better and is currently on day 4.  He has decided to not take the medication.  Message sent to prescriber.        [name] 03/31/2020, 8:15 AM [facility] Outpatient Pharmacist Phone# [phone]

## 2020-08-19 DIAGNOSIS — Z23 Encounter for immunization: Secondary | ICD-10-CM | POA: Diagnosis not present

## 2020-08-20 ENCOUNTER — Other Ambulatory Visit (HOSPITAL_COMMUNITY): Payer: Self-pay

## 2020-08-20 MED ORDER — CARESTART COVID-19 HOME TEST VI KIT
PACK | 0 refills | Status: DC
  Filled 2020-08-20: qty 4, 4d supply, fill #0

## 2020-11-02 ENCOUNTER — Other Ambulatory Visit: Payer: Self-pay

## 2021-02-18 DIAGNOSIS — B356 Tinea cruris: Secondary | ICD-10-CM | POA: Diagnosis not present

## 2021-02-18 DIAGNOSIS — Z8249 Family history of ischemic heart disease and other diseases of the circulatory system: Secondary | ICD-10-CM | POA: Diagnosis not present

## 2021-02-18 DIAGNOSIS — F439 Reaction to severe stress, unspecified: Secondary | ICD-10-CM | POA: Diagnosis not present

## 2021-02-18 DIAGNOSIS — R5383 Other fatigue: Secondary | ICD-10-CM | POA: Diagnosis not present

## 2021-02-18 DIAGNOSIS — K219 Gastro-esophageal reflux disease without esophagitis: Secondary | ICD-10-CM | POA: Diagnosis not present

## 2021-02-26 ENCOUNTER — Other Ambulatory Visit (HOSPITAL_COMMUNITY): Payer: Self-pay

## 2021-02-26 MED ORDER — CARESTART COVID-19 HOME TEST VI KIT
PACK | 0 refills | Status: DC
  Filled 2021-02-26: qty 4, 4d supply, fill #0

## 2021-03-10 ENCOUNTER — Other Ambulatory Visit: Payer: Self-pay

## 2021-03-10 ENCOUNTER — Encounter: Payer: Self-pay | Admitting: Cardiovascular Disease

## 2021-03-10 ENCOUNTER — Ambulatory Visit: Payer: 59 | Admitting: Cardiovascular Disease

## 2021-03-10 VITALS — BP 134/82 | HR 71 | Ht 76.0 in | Wt 214.0 lb

## 2021-03-10 DIAGNOSIS — R002 Palpitations: Secondary | ICD-10-CM | POA: Diagnosis not present

## 2021-03-10 DIAGNOSIS — Z8249 Family history of ischemic heart disease and other diseases of the circulatory system: Secondary | ICD-10-CM

## 2021-03-10 DIAGNOSIS — R42 Dizziness and giddiness: Secondary | ICD-10-CM | POA: Diagnosis not present

## 2021-03-10 NOTE — Patient Instructions (Signed)
Medication Instructions:  No changes *If you need a refill on your cardiac medications before your next appointment, please call your pharmacy*   Lab Work: None ordered If you have labs (blood work) drawn today and your tests are completely normal, you will receive your results only by: [place] (if you have MyChart) OR A paper copy in the mail If you have any lab test that is abnormal or we need to change your treatment, we will call you to review the results.   Testing/Procedures: Your physician has requested that you have an exercise tolerance test. For further information please visit [email or web address]. Please also follow instruction sheet, as given. This will take place at [address], [address]. Do not drink or eat foods with caffeine for 24 hours before the test. (Chocolate, coffee, tea, or energy drinks) If you use an inhaler, bring it with you to the test. Do not smoke for 4 hours before the test. Wear comfortable shoes and clothing.  Dr. Sallyanne Kuster has ordered a CT coronary calcium score. This test is done at [address]. This is $99 out of pocket.   Coronary CalciumScan A coronary calcium scan is an imaging test used to look for deposits of calcium and other fatty materials (plaques) in the inner lining of the blood vessels of the heart (coronary arteries). These deposits of calcium and plaques can partly clog and narrow the coronary arteries without producing any symptoms or warning signs. This puts a person at risk for a heart attack. This test can detect these deposits before symptoms develop. Tell a health care provider about: Any allergies you have. All medicines you are taking, including vitamins, herbs, eye drops, creams, and over-the-counter medicines. Any problems you or family members have had with anesthetic medicines. Any blood disorders you have. Any surgeries you have had. Any medical conditions you have. Whether you are  pregnant or may be pregnant. What are the risks? Generally, this is a safe procedure. However, problems may occur, including: Harm to a pregnant woman and her unborn baby. This test involves the use of radiation. Radiation exposure can be dangerous to a pregnant woman and her unborn baby. If you are pregnant, you generally should not have this procedure done. Slight increase in the risk of cancer. This is because of the radiation involved in the test. What happens before the procedure? No preparation is needed for this procedure. What happens during the procedure? You will undress and remove any jewelry around your neck or chest. You will put on a hospital gown. Sticky electrodes will be placed on your chest. The electrodes will be connected to an electrocardiogram (ECG) machine to record a tracing of the electrical activity of your heart. A CT scanner will take pictures of your heart. During this time, you will be asked to lie still and hold your breath for 2-3 seconds while a picture of your heart is being taken. The procedure may vary among health care providers and hospitals. What happens after the procedure? You can get dressed. You can return to your normal activities. It is up to you to get the results of your test. Ask your health care provider, or the department that is doing the test, when your results will be ready. Summary A coronary calcium scan is an imaging test used to look for deposits of calcium and other fatty materials (plaques) in the inner lining of the blood vessels of the heart (coronary arteries). Generally, this is a  safe procedure. Tell your health care provider if you are pregnant or may be pregnant. No preparation is needed for this procedure. A CT scanner will take pictures of your heart. You can return to your normal activities after the scan is done. This information is not intended to replace advice given to you by your health care provider. Make sure you  discuss any questions you have with your health care provider. Document Released: 09/02/2007 Document Revised: 01/24/2016 Document Reviewed: 01/24/2016 Elsevier Interactive Patient Education  2017 [place]: At [facility], you and your health needs are our priority.  As part of our continuing mission to provide you with exceptional heart care, we have created designated Provider Care Teams.  These Care Teams include your primary Cardiologist (physician) and Advanced Practice Providers (APPs -  Physician Assistants and Nurse Practitioners) who all work together to provide you with the care you need, when you need it.  We recommend signing up for the patient portal called "MyChart".  Sign up information is provided on this After Visit Summary.  MyChart is used to connect with patients for Virtual Visits (Telemedicine).  Patients are able to view lab/test results, encounter notes, upcoming appointments, etc.  Non-urgent messages can be sent to your provider as well.   To learn more about what you can do with MyChart, go to [email or web address].    Your next appointment:   Follow up as needed with Dr. Sallyanne Kuster  Other Instructions  [employer]: Website: [email or web address]  DR. Sallyanne Kuster RECOMMENDS YOU PURCHASE  " [employer]ia" [employer] FROM THE  [employer]  APP PLAY STORE.  THE APP IS FREE , BUT THE  EQUIPMENT HAS A COST. IT ALLOWS YOU TO OBTAIN A RECORDING OF YOUR HEART RATE AND RHYTHM BY PROVIDING A SHORT STRIP THAT YOU CAN SHARE WITH YOUR PROVIDER.

## 2021-03-10 NOTE — Progress Notes (Signed)
Cardiology Office Note:    Date:  03/10/2021   ID:  Frank [name], DOB [birth year], MRN [id number]  PCP:  Antony Contras, MD   [facility] Providers Cardiologist:  None     Referring MD: Antony Contras, MD   Chief Complaint  Patient presents with   Palpitations  Frank [name] is a 50 y.o. male who is being seen today for the evaluation of palpitations at the request of Antony Contras, MD.   History of Present Illness:    Frank [name] is a 50 y.o. male with a hx of generally good health, who is a pediatric nurse, in the emergency department.  He is recently had problems with increasing frequency of palpitations.  He is often noticed this during periods of increased emotional stress entheses recently completed his masters in nursing very long hours and drank more coffee than usual.  He particularly noticed strong palpitations when he dropped his daughter off to college.  He describes his palpitations as a big thump in his chest that makes him "stop in his tracks".  This happens several times a year.  He has not had sustained constant racing heartbeat.  In addition, he has noticed occasional problems with chest tightness, but this resolves after he uses Tums or pantoprazole.  He is confident that it represents GERD.  He never has chest tightness during exercise.  He does go to the gym regularly.  He has noticed that sometimes he feels a little dizzy toward the end of his workout, but it sounds like he may not be hydrating enough on the days when the dizziness occurs.  He has not had full-blown syncope.  He denies exertional dyspnea.  He does not have orthopnea, PND, lower extremity edema, claudication, erectile dysfunction, focal neurological complaints.  Frank [name] is also concerned about a possible family history of heart disease.  His dad had stents around the age of 70.  He had his diabetes mellitus and underwent an aortic valve replacement.  He has a pacemaker.  His mother has  also had aortic valve replacement and after a radiofrequency ablation procedure had to have a pacemaker as well.    Past Medical History:  Diagnosis Date   Allergic rhinitis    Asthma    Cat bite     Past Surgical History:  Procedure Laterality Date   none      Current Medications: Current Meds  Medication Sig   albuterol (VENTOLIN HFA) 108 (90 Base) MCG/ACT inhaler Inhale 1-2 puffs into the lungs every 6 (six) hours as needed for wheezing or shortness of breath.   calcium carbonate (TUMS - DOSED IN MG ELEMENTAL CALCIUM) 500 MG chewable tablet Chew 1 tablet by mouth daily.   cetirizine (ZYRTEC) 5 MG tablet Take 5 mg by mouth daily.   ibuprofen (ADVIL,MOTRIN) 200 MG tablet Take 200 mg by mouth every 6 (six) hours as needed.   Nirmatrelvir & Ritonavir 20 x 150 MG & 10 x 100MG TBPK TAKE 3 TABLETS BY MOUTH TWICE DAILY FOR 5 DAYS.   pantoprazole (PROTONIX) 40 MG tablet 1 tablet   Current Facility-Administered Medications for the 03/10/21 encounter (Office Visit) with Consuello Lassalle, MD  Medication   0.9 %  sodium chloride infusion     Allergies:   Peanut (diagnostic)   Social History   Socioeconomic History   Marital status: Married    Spouse name: Not on file   Number of children: Not on file   Years of education: Not  on file   Highest education level: Not on file  Occupational History   Not on file  Tobacco Use   Smoking status: Never   Smokeless tobacco: Never  Vaping Use   Vaping Use: Never used  Substance and Sexual Activity   Alcohol use: Yes   Drug use: No   Sexual activity: Not on file  Other Topics Concern   Not on file  Social History Narrative   Not on file   Social Determinants of Health   Financial Resource Strain: Not on file  Food Insecurity: Not on file  Transportation Needs: Not on file  Physical Activity: Not on file  Stress: Not on file  Social Connections: Not on file     Family History: The patient's family history includes CVA in  his maternal grandmother; Cancer in his paternal grandfather; Diabetes in his father and maternal grandmother; Hypertension in his father; Multiple myeloma in his father; Prostate cancer in his maternal grandfather. There is no history of Colon cancer.  ROS:   Please see the history of present illness.     All other systems reviewed and are negative.  EKGs/Labs/Other Studies Reviewed:    The following studies were reviewed today:   EKG:  EKG is ordered today.  The ekg ordered today demonstrates normal sinus rhythm, normal tracing.  QTc 406  Recent Labs: No results found for requested labs within last 8760 hours.  Recent Lipid Panel No results found for: CHOL, TRIG, HDL, CHOLHDL, VLDL, LDLCALC, LDLDIRECT 02/18/2021 Hemoglobin 16.1, creatinine 0.9, potassium 4.4, ALT 39, TSH 1.11, hemoglobin A1c 5.6% 05/29/2018 Cholesterol 144, triglycerides 46, HDL 54, LDL 81  Risk Assessment/Calculations:         Physical Exam:    VS:  BP 134/82    Pulse 71    Ht _0  (1.93 m)    Wt 214 lb (97.1 kg)    SpO2 96%    BMI 26.05 kg/m     Wt Readings from Last 3 Encounters:  03/10/21 214 lb (97.1 kg)  12/18/19 215 lb (97.5 kg)  11/13/19 217 lb 3.2 oz (98.5 kg)     GEN: Appears fit and minimally overweight well nourished, well developed in no acute distress HEENT: Normal NECK: No JVD; No carotid bruits LYMPHATICS: No lymphadenopathy CARDIAC: RRR, no murmurs, rubs, gallops RESPIRATORY:  Clear to auscultation without rales, wheezing or rhonchi  ABDOMEN: Soft, non-tender, non-distended MUSCULOSKELETAL:  No edema; No deformity  SKIN: Warm and dry NEUROLOGIC:  Alert and oriented x 3 PSYCHIATRIC:  Normal affect   ASSESSMENT:    1. Family history of early CAD   2. Palpitations   3. Dizziness    PLAN:    In order of problems listed above:  Palpitations: It sounds like he is describing isolated PACs and PVCs.  These can be increased by emotional stress, fatigue, use of albuterol, excessive  caffeine or other stimulants.  Try to avoid triggers, getting of sleep, and continue to exercise.  The symptoms are not predictable and are infrequent enough that we would likely miss them on an arrhythmia monitor.  Advised a commercially available device such as a smart watch or Kardia device and he can send me the rhythm strips via the EMR. Family history of heart disease: Recommended a coronary calcium score.  Meds risk factors are generally very well addressed, but he might benefit from lipid-lowering therapy if he has a high calcium score. Exertional dizziness: This does not happen during exercise, but rather towards  the end of exercise.  It sounds like he may simply not be hydrating enough.  Advised more aggressive hydration before during and after exercise.  Not unreasonable to schedule for a plain treadmill stress test to see if he has any exercise related arrhythmia.      Shared Decision Making/Informed Consent The risks [chest pain, shortness of breath, cardiac arrhythmias, dizziness, blood pressure fluctuations, myocardial infarction, stroke/transient ischemic attack, and life-threatening complications (estimated to be 1 in 10,000)], benefits (risk stratification, diagnosing coronary artery disease, treatment guidance) and alternatives of an exercise tolerance test were discussed in detail with Mr. Macke and he agrees to proceed.    Medication Adjustments/Labs and Tests Ordered: Current medicines are reviewed at length with the patient today.  Concerns regarding medicines are outlined above.  Orders Placed This Encounter  Procedures   CT CARDIAC SCORING (SELF PAY ONLY)   Exercise Tolerance Test   EKG 12-Lead   No orders of the defined types were placed in this encounter.   Patient Instructions  Medication Instructions:  No changes *If you need a refill on your cardiac medications before your next appointment, please call your pharmacy*   Lab Work: None ordered If you have  labs (blood work) drawn today and your tests are completely normal, you will receive your results only by: [place] (if you have MyChart) OR A paper copy in the mail If you have any lab test that is abnormal or we need to change your treatment, we will call you to review the results.   Testing/Procedures: Your physician has requested that you have an exercise tolerance test. For further information please visit [email or web address]. Please also follow instruction sheet, as given. This will take place at [address], [address]. Do not drink or eat foods with caffeine for 24 hours before the test. (Chocolate, coffee, tea, or energy drinks) If you use an inhaler, bring it with you to the test. Do not smoke for 4 hours before the test. Wear comfortable shoes and clothing.  Dr. Sallyanne Kuster has ordered a CT coronary calcium score. This test is done at [address]. This is $99 out of pocket.   Coronary CalciumScan A coronary calcium scan is an imaging test used to look for deposits of calcium and other fatty materials (plaques) in the inner lining of the blood vessels of the heart (coronary arteries). These deposits of calcium and plaques can partly clog and narrow the coronary arteries without producing any symptoms or warning signs. This puts a person at risk for a heart attack. This test can detect these deposits before symptoms develop. Tell a health care provider about: Any allergies you have. All medicines you are taking, including vitamins, herbs, eye drops, creams, and over-the-counter medicines. Any problems you or family members have had with anesthetic medicines. Any blood disorders you have. Any surgeries you have had. Any medical conditions you have. Whether you are pregnant or may be pregnant. What are the risks? Generally, this is a safe procedure. However, problems may occur, including: Harm to a pregnant woman and her unborn baby. This test involves  the use of radiation. Radiation exposure can be dangerous to a pregnant woman and her unborn baby. If you are pregnant, you generally should not have this procedure done. Slight increase in the risk of cancer. This is because of the radiation involved in the test. What happens before the procedure? No preparation is needed for this procedure. What happens during the procedure? You  will undress and remove any jewelry around your neck or chest. You will put on a hospital gown. Sticky electrodes will be placed on your chest. The electrodes will be connected to an electrocardiogram (ECG) machine to record a tracing of the electrical activity of your heart. A CT scanner will take pictures of your heart. During this time, you will be asked to lie still and hold your breath for 2-3 seconds while a picture of your heart is being taken. The procedure may vary among health care providers and hospitals. What happens after the procedure? You can get dressed. You can return to your normal activities. It is up to you to get the results of your test. Ask your health care provider, or the department that is doing the test, when your results will be ready. Summary A coronary calcium scan is an imaging test used to look for deposits of calcium and other fatty materials (plaques) in the inner lining of the blood vessels of the heart (coronary arteries). Generally, this is a safe procedure. Tell your health care provider if you are pregnant or may be pregnant. No preparation is needed for this procedure. A CT scanner will take pictures of your heart. You can return to your normal activities after the scan is done. This information is not intended to replace advice given to you by your health care provider. Make sure you discuss any questions you have with your health care provider. Document Released: 09/02/2007 Document Revised: 01/24/2016 Document Reviewed: 01/24/2016 Elsevier Interactive Patient Education  2017  [place]: At [facility], you and your health needs are our priority.  As part of our continuing mission to provide you with exceptional heart care, we have created designated Provider Care Teams.  These Care Teams include your primary Cardiologist (physician) and Advanced Practice Providers (APPs -  Physician Assistants and Nurse Practitioners) who all work together to provide you with the care you need, when you need it.  We recommend signing up for the patient portal called "MyChart".  Sign up information is provided on this After Visit Summary.  MyChart is used to connect with patients for Virtual Visits (Telemedicine).  Patients are able to view lab/test results, encounter notes, upcoming appointments, etc.  Non-urgent messages can be sent to your provider as well.   To learn more about what you can do with MyChart, go to [email or web address].    Your next appointment:   Follow up as needed with Dr. Sallyanne Kuster  Other Instructions  [employer]: Website: [email or web address]  DR. Sallyanne Kuster RECOMMENDS YOU PURCHASE  " Kardia" [employer] FROM THE  [employer]  APP PLAY STORE.  THE APP IS FREE , BUT THE  EQUIPMENT HAS A COST. IT ALLOWS YOU TO OBTAIN A RECORDING OF YOUR HEART RATE AND RHYTHM BY PROVIDING A SHORT STRIP THAT YOU CAN SHARE WITH YOUR PROVIDER.       Signed, Sanda Klein, MD  03/10/2021 1:17 PM     [facility]

## 2021-03-18 ENCOUNTER — Other Ambulatory Visit (HOSPITAL_COMMUNITY): Payer: Self-pay

## 2021-03-18 ENCOUNTER — Telehealth (HOSPITAL_COMMUNITY): Payer: Self-pay | Admitting: *Deleted

## 2021-03-18 MED ORDER — PANTOPRAZOLE SODIUM 40 MG PO TBEC
DELAYED_RELEASE_TABLET | ORAL | 1 refills | Status: DC
  Filled 2021-03-18: qty 30, 30d supply, fill #0

## 2021-03-18 NOTE — Telephone Encounter (Signed)
Close encounter 

## 2021-03-22 ENCOUNTER — Ambulatory Visit (HOSPITAL_BASED_OUTPATIENT_CLINIC_OR_DEPARTMENT_OTHER): Payer: 59

## 2021-03-23 ENCOUNTER — Ambulatory Visit (HOSPITAL_COMMUNITY)
Admission: RE | Admit: 2021-03-23 | Discharge: 2021-03-23 | Disposition: A | Discharge status: Home / Self Care | Payer: 59 | Source: Ambulatory Visit | Attending: Internal Medicine | Admitting: Internal Medicine

## 2021-03-23 ENCOUNTER — Other Ambulatory Visit: Payer: Self-pay

## 2021-03-23 DIAGNOSIS — R0602 Shortness of breath: Secondary | ICD-10-CM | POA: Diagnosis not present

## 2021-03-23 DIAGNOSIS — Z8249 Family history of ischemic heart disease and other diseases of the circulatory system: Secondary | ICD-10-CM

## 2021-03-23 DIAGNOSIS — R42 Dizziness and giddiness: Secondary | ICD-10-CM | POA: Diagnosis not present

## 2021-03-23 LAB — EXERCISE TOLERANCE TEST
Angina Index: 0
Duke Treadmill Score: 12
Estimated workload: 13.4
Exercise duration (min): 12 min
Exercise duration (sec): 2 s
MPHR: 171 {beats}/min
Peak HR: 164 {beats}/min
Percent HR: 95 %
Rest HR: 72 {beats}/min
ST Depression (mm): 0 mm

## 2021-03-25 ENCOUNTER — Ambulatory Visit (HOSPITAL_BASED_OUTPATIENT_CLINIC_OR_DEPARTMENT_OTHER)
Admission: RE | Admit: 2021-03-25 | Discharge: 2021-03-25 | Disposition: A | Discharge status: Home / Self Care | Payer: 59 | Source: Ambulatory Visit | Attending: Cardiovascular Disease | Admitting: Cardiovascular Disease

## 2021-03-25 ENCOUNTER — Other Ambulatory Visit: Payer: Self-pay

## 2021-03-25 DIAGNOSIS — Z8249 Family history of ischemic heart disease and other diseases of the circulatory system: Secondary | ICD-10-CM | POA: Insufficient documentation

## 2021-06-07 ENCOUNTER — Other Ambulatory Visit: Payer: Self-pay

## 2021-06-21 ENCOUNTER — Other Ambulatory Visit (HOSPITAL_COMMUNITY): Payer: Self-pay

## 2021-06-21 MED ORDER — MUPIROCIN 2 % EX OINT
1.0000 "application " | TOPICAL_OINTMENT | Freq: Two times a day (BID) | CUTANEOUS | 1 refills | Status: AC
  Filled 2021-06-21: qty 22, 5d supply, fill #0

## 2021-07-05 ENCOUNTER — Ambulatory Visit (HOSPITAL_COMMUNITY)
Admission: EM | Admit: 2021-07-05 | Discharge: 2021-07-05 | Disposition: A | Discharge status: Home / Self Care | Payer: PRIVATE HEALTH INSURANCE | Attending: Emergency Medicine | Admitting: Emergency Medicine

## 2021-07-05 ENCOUNTER — Ambulatory Visit (INDEPENDENT_AMBULATORY_CARE_PROVIDER_SITE_OTHER): Payer: PRIVATE HEALTH INSURANCE

## 2021-07-05 ENCOUNTER — Other Ambulatory Visit (HOSPITAL_COMMUNITY): Payer: Self-pay

## 2021-07-05 ENCOUNTER — Encounter (HOSPITAL_COMMUNITY): Payer: Self-pay

## 2021-07-05 DIAGNOSIS — S2232XA Fracture of one rib, left side, initial encounter for closed fracture: Secondary | ICD-10-CM | POA: Diagnosis not present

## 2021-07-05 DIAGNOSIS — S2242XA Multiple fractures of ribs, left side, initial encounter for closed fracture: Secondary | ICD-10-CM | POA: Diagnosis not present

## 2021-07-05 MED ORDER — OXYCODONE-ACETAMINOPHEN 5-325 MG PO TABS
1.0000 | ORAL_TABLET | Freq: Four times a day (QID) | ORAL | 0 refills | Status: AC | PRN
  Filled 2021-07-05: qty 12, 3d supply, fill #0

## 2021-07-05 MED ORDER — MELOXICAM 15 MG PO TABS
15.0000 mg | ORAL_TABLET | Freq: Every day | ORAL | 0 refills | Status: AC
  Filled 2021-07-05: qty 15, 15d supply, fill #0

## 2021-07-05 NOTE — Discharge Instructions (Signed)
Take Percocet as needed for pain. ?Please take Zofran with Percocet in order to avoid nausea start MiraLAX. ?Rib fracture should heal in 4 to 6 weeks. ?

## 2021-07-05 NOTE — ED Provider Notes (Signed)
? ?[facility] ?Provider Note ? ?Patient Contact: 9:13 AM (approximate) ? ? ?History  ? ?Back Pain ? ? ?HPI ? ?Frank [name] is a 51 y.o. male with a history of palpitations and coronary artery disease, presents to the urgent care with left-sided chest wall discomfort.  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 Patient also had recent travel to [place]. He states that he was tachycardic when he noticed discomfort but states that tachycardia has improved over the past 1 to 2 days.  He denies chest tightness or shortness of breath.  Denies history of DVT or PE.  He is not a daily smoker.  He states the pain is reproducible with palpation.  He has never experienced similar symptoms in the past. ? ?  ? ? ?Physical Exam  ? ?Triage Vital Signs: ?ED Triage Vitals  ?Enc Vitals Group  ?   BP 07/05/21 0845 130/68  ?   Pulse Rate 07/05/21 0844 98  ?   Resp 07/05/21 0844 18  ?   Temp 07/05/21 0844 98.1 ?F (36.7 ?C)  ?   Temp Source 07/05/21 0844 Oral  ?   SpO2 07/05/21 0844 99 %  ?   Weight --   ?   Height --   ?   Head Circumference --   ?   Peak Flow --   ?   Pain Score 07/05/21 0844 5  ?   Pain Loc --   ?   Pain Edu? --   ?   Excl. in [place]? --   ? ? ?Most recent vital signs: ?Vitals:  ? 07/05/21 0844 07/05/21 0845  ?BP:  130/68  ?Pulse: 98   ?Resp: 18 16  ?Temp: 98.1 ?F (36.7 ?C)   ?SpO2: 99%   ? ? ? ?General: Alert and in no acute distress. ?Eyes:  PERRL. EOMI. ?Head: No acute traumatic findings ?ENT: ?     Ears: Tms are pearly.  ?     Nose: No congestion/rhinnorhea. ?     Mouth/Throat: Mucous membranes are moist. ?Neck: No stridor. No cervical spine tenderness to palpation. ?Cardiovascular:  Good peripheral perfusion ?Respiratory: Normal respiratory effort without tachypnea or retractions. Lungs CTAB. Good air entry to the bases with no decreased or absent breath sounds. ?Gastrointestinal: Bowel  sounds ?4 quadrants. Soft and nontender to palpation. No guarding or rigidity. No palpable masses. No distention. No CVA tenderness. ?Musculoskeletal: Full range of motion to all extremities.  ?Neurologic:  No gross focal neurologic deficits are appreciated.  ?Skin:   No rash noted ?Other: ? ? ?ED Results / Procedures / Treatments  ? ?Labs ?(all labs ordered are listed, but only abnormal results are displayed) ?Labs Reviewed - No data to display ? ? ?EKG ? ?Normal sinus rhythm without ST segment elevation or other apparent arrhythmia. ? ? ?RADIOLOGY ? ?I personally viewed and evaluated these images as part of my medical decision making, as well as reviewing the written report by the radiologist. ? ?ED Provider Interpretation: Patient has 8th anterior lateral rib fracture. ? ?PROCEDURES: ? ?Critical Care performed: No ? ?Procedures ? ? ?MEDICATIONS ORDERED IN ED: ?Medications - No data to display ? ? ?IMPRESSION / MDM / ASSESSMENT AND PLAN / ED COURSE  ?I reviewed the triage vital signs and the nursing notes. ?             ?               ?  Assessment and plan ?Chest wall pain ? ?Differential diagnosis includes, but is not limited to, pneumothorax, costochondritis, rib fracture, muscle strain... ? ?Vital signs are reassuring at triage.  On physical exam, patient was alert, toxic appearing.  EKG indicated normal sinus rhythm without ST segment elevation or other apparent arrhythmia.  Patient had 1/8 rib fracture visualized on chest x-ray.  Patient was prescribed a short course of Percocet for pain.  Return precautions were given to return with new or worsening symptoms. ?  ? ? ?FINAL CLINICAL IMPRESSION(S) / ED DIAGNOSES  ? ?Final diagnoses:  ?Closed fracture of one rib of left side, initial encounter  ? ? ? ?Rx / DC Orders  ? ?ED Discharge Orders   ? ?      Ordered  ?  oxyCODONE-acetaminophen (PERCOCET/ROXICET) 5-325 MG tablet  Every 6 hours PRN       ? 07/05/21 0948  ?  meloxicam (MOBIC) 15 MG tablet  Daily       ?  07/05/21 0948  ? ?  ?  ? ?  ? ? ? ?Note:  This document was prepared using Dragon voice recognition software and may include unintentional dictation errors. ?  ?Lannie Fields, PA-C ?07/05/21 6433 ? ?

## 2021-07-05 NOTE — ED Triage Notes (Signed)
C/o rib pain and lower back pain. 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  ?

## 2021-09-28 ENCOUNTER — Other Ambulatory Visit (HOSPITAL_COMMUNITY): Payer: Self-pay

## 2021-09-28 ENCOUNTER — Encounter (HOSPITAL_COMMUNITY): Payer: Self-pay

## 2021-09-28 ENCOUNTER — Ambulatory Visit (HOSPITAL_COMMUNITY)
Admission: EM | Admit: 2021-09-28 | Discharge: 2021-09-28 | Disposition: A | Discharge status: Home / Self Care | Payer: PRIVATE HEALTH INSURANCE | Attending: Physician Assistant | Admitting: Physician Assistant

## 2021-09-28 DIAGNOSIS — R509 Fever, unspecified: Secondary | ICD-10-CM | POA: Diagnosis present

## 2021-09-28 DIAGNOSIS — R051 Acute cough: Secondary | ICD-10-CM | POA: Diagnosis present

## 2021-09-28 DIAGNOSIS — M2550 Pain in unspecified joint: Secondary | ICD-10-CM | POA: Diagnosis present

## 2021-09-28 DIAGNOSIS — G9331 Postviral fatigue syndrome: Secondary | ICD-10-CM

## 2021-09-28 LAB — COMPREHENSIVE METABOLIC PANEL
ALT: 35 U/L (ref 0–44)
AST: 28 U/L (ref 15–41)
Albumin: 3.7 g/dL (ref 3.5–5.0)
Alkaline Phosphatase: 64 U/L (ref 38–126)
Anion gap: 8 (ref 5–15)
BUN: 10 mg/dL (ref 6–20)
CO2: 26 mmol/L (ref 22–32)
Calcium: 8.8 mg/dL — ABNORMAL LOW (ref 8.9–10.3)
Chloride: 104 mmol/L (ref 98–111)
Creatinine, Ser: 0.98 mg/dL (ref 0.61–1.24)
GFR, Estimated: >60 mL/min (ref >60)
Glucose, Bld: 132 mg/dL — ABNORMAL HIGH (ref 70–99)
Potassium: 4.2 mmol/L (ref 3.5–5.1)
Sodium: 138 mmol/L (ref 135–145)
Total Bilirubin: 1 mg/dL (ref 0.3–1.2)
Total Protein: 6.5 g/dL (ref 6.5–8.1)

## 2021-09-28 LAB — CBC WITH DIFFERENTIAL/PLATELET
Abs Immature Granulocytes: 0.02 10*3/uL (ref 0.00–0.07)
Basophils Absolute: 0 10*3/uL (ref 0.0–0.1)
Basophils Relative: 0 %
Eosinophils Absolute: 0.2 10*3/uL (ref 0.0–0.5)
Eosinophils Relative: 3 %
HCT: 43.2 % (ref 39.0–52.0)
Hemoglobin: 15.2 g/dL (ref 13.0–17.0)
Immature Granulocytes: 0 %
Lymphocytes Relative: 14 %
Lymphs Abs: 1.1 10*3/uL (ref 0.7–4.0)
MCH: 32.3 pg (ref 26.0–34.0)
MCHC: 35.2 g/dL (ref 30.0–36.0)
MCV: 91.7 fL (ref 80.0–100.0)
Monocytes Absolute: 0.8 10*3/uL (ref 0.1–1.0)
Monocytes Relative: 10 %
Neutro Abs: 5.7 10*3/uL (ref 1.7–7.7)
Neutrophils Relative %: 73 %
Platelets: 133 10*3/uL — ABNORMAL LOW (ref 150–400)
RBC: 4.71 MIL/uL (ref 4.22–5.81)
RDW: 12.2 % (ref 11.5–15.5)
WBC: 7.8 10*3/uL (ref 4.0–10.5)
nRBC: 0 % (ref 0.0–0.2)

## 2021-09-28 MED ORDER — IBUPROFEN 800 MG PO TABS
800.0000 mg | ORAL_TABLET | Freq: Three times a day (TID) | ORAL | 0 refills | Status: AC
  Filled 2021-09-28: qty 21, 7d supply, fill #0

## 2021-09-28 NOTE — ED Triage Notes (Signed)
Pt presents with c/o fatigue, cough, fever, increased heart rate when outside, and night sweats that began last Wednesday. Pt states yesterday morning he began experiencing bilateral ankle an hand pain. Pt taking advil and tylenol.

## 2021-09-28 NOTE — Discharge Instructions (Addendum)
Advised to continue using ibuprofen on a regular basis to treat the fever and joint pain. Advised to observe as waiting for the labs to return which takes 48 hours.  I did screen for Lyme's and [facility] spotted fever. Advised to follow-up with PCP or return to urgent care if symptoms fail to improve within the next 2 to 3 days.

## 2021-09-28 NOTE — ED Provider Notes (Signed)
[place]    CSN: [id number] Arrival date & time: 09/28/21  1245      History   Chief Complaint Chief Complaint  Patient presents with   Fatigue   Cough    HPI Frank [name] is a 51 y.o. male.   51 year old male presents with fever, fatigue, cough, sweats and joint pain.  Patient indicates for the past week he has been having persistent cough which is dry, fatigue and lethargy, joint pain which is moderate and in the morning when he wakes up affecting the bilateral ankles elbows and wrist.  Patient indicates the joint pain tends to improve a little bit as the day progresses.  Patient is also having fever which has been intermittent but persistent ranging anywhere from 99-100.5.  Patient does indicate he has had some intermittent mild headaches.  Patient denies sore throat, shortness of breath, chest pain, and no rashes.  Patient also indicates he has not had any known tick exposure.  Patient does indicate that where he was out before he left he was here at [facility] at the beach where there were some mosquitoes but also up in [place] when he was working and he was exposed to mosquitoes there also.  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  Patient indicates he did sweat profusely at the time he was doing that but he took breaks and drink water.  Patient relates he has been staying in a cool environment for the past couple days since he has been back, he is eating and drinking fluids well, and he has not noticed any unusual rashes.  Patient has taken ibuprofen which tends to get some relief from the fever and the joint  pain.   Cough Associated symptoms: chills and fever     Past Medical History:  Diagnosis Date   Allergic rhinitis    Asthma    Cat bite     Patient Active Problem List   Diagnosis Date Noted   Bloating 04/24/2017   Change in bowel habits 04/24/2017   Lower abdominal pain 04/24/2017   Gastroesophageal reflux disease 04/24/2017    Past Surgical History:  Procedure Laterality Date   none         Home Medications    Prior to Admission medications   Medication Sig Start Date End Date Taking? Authorizing Provider  albuterol (VENTOLIN HFA) 108 (90 Base) MCG/ACT inhaler Inhale 1-2 puffs into the lungs every 6 (six) hours as needed for wheezing or shortness of breath.    [provider]  calcium carbonate (TUMS - DOSED IN MG ELEMENTAL CALCIUM) 500 MG chewable tablet Chew 1 tablet by mouth daily.    [provider]  cetirizine (ZYRTEC) 5 MG tablet Take 5 mg by mouth daily. Patient not taking: Reported on 09/28/2021    [provider]  ibuprofen (ADVIL,MOTRIN) 200 MG tablet Take 200 mg by mouth every 6 (six) hours as needed.    [provider]  mupirocin ointment (BACTROBAN) 2 % Apply 1 application topically 2 (two) times daily for 5 days Patient not taking: Reported on 09/28/2021 06/20/21   Kristen Cardinal,  NP  pantoprazole (PROTONIX) 40 MG tablet 1 tablet Patient not taking: Reported on 09/28/2021 02/18/21   [provider]  pantoprazole (PROTONIX) 40 MG tablet Take 1 tablet by mouth once daily 03/18/21       Family History Family History  Problem Relation Age of Onset   Multiple myeloma Father    Diabetes Father    Hypertension Father    Diabetes Maternal Grandmother    CVA Maternal Grandmother    Prostate cancer Maternal Grandfather    Cancer Paternal Grandfather    Colon cancer Neg Hx     Social History Social History   Tobacco Use   Smoking status: Never   Smokeless tobacco: Never  Vaping Use   Vaping Use: Never used   Substance Use Topics   Alcohol use: Yes   Drug use: No     Allergies   Peanut (diagnostic)   Review of Systems Review of Systems  Constitutional:  Positive for chills, fatigue and fever.  Respiratory:  Positive for cough.   Musculoskeletal:  Positive for arthralgias (ankles, elbows and wrists).     Physical Exam Triage Vital Signs ED Triage Vitals  Enc Vitals Group     BP 09/28/21 0857 134/79     Pulse Rate 09/28/21 0857 88     Resp 09/28/21 0857 14     Temp 09/28/21 0857 99.7 F (37.6 C)     Temp Source 09/28/21 0857 Oral     SpO2 09/28/21 0857 95 %     Weight --      Height --      Head Circumference --      Peak Flow --      Pain Score 09/28/21 0855 2     Pain Loc --      Pain Edu? --      Excl. in [place]? --    No data found.  Updated Vital Signs BP 134/79 (BP Location: Left Arm)   Pulse 88   Temp 99.7 F (37.6 C) (Oral)   Resp 14   SpO2 95%   Visual Acuity Right Eye Distance:   Left Eye Distance:   Bilateral Distance:    Right Eye Near:   Left Eye Near:    Bilateral Near:     Physical Exam Constitutional:      Appearance: Normal appearance.  HENT:     Right Ear: Tympanic membrane and ear canal normal.     Left Ear: Tympanic membrane and ear canal normal.     Mouth/Throat:     Mouth: Mucous membranes are moist.     Pharynx: Oropharynx is clear. Uvula midline. No pharyngeal swelling or posterior oropharyngeal erythema.  Cardiovascular:     Rate and Rhythm: Normal rate and regular rhythm.     Heart sounds: Normal heart sounds.  Pulmonary:     Effort: Pulmonary effort is normal.     Breath sounds: Normal breath sounds and air entry. No wheezing, rhonchi or rales.  Abdominal:     General: Abdomen is flat. Bowel sounds are normal.     Palpations: Abdomen is soft.     Tenderness: There is no abdominal tenderness. There is no guarding or rebound.  Lymphadenopathy:     Cervical: No cervical adenopathy.  Skin:    Comments: Skin: There are no  rashes noted on the body, there is no unusual swelling at the ankle, elbows, or wrists.  Neurological:     Mental Status: He is alert.  UC Treatments / Results  Labs (all labs ordered are listed, but only abnormal results are displayed) Labs Reviewed  CBC WITH DIFFERENTIAL/PLATELET  COMPREHENSIVE METABOLIC PANEL  LYME DISEASE SEROLOGY W/REFLEX  ROCKY MTN SPOTTED FVR ABS PNL(IGG+IGM)    EKG   Radiology No results found.  Procedures Procedures (including critical care time)  Medications Ordered in UC Medications - No data to display  Initial Impression / Assessment and Plan / UC Course  I have reviewed the triage vital signs and the nursing notes.  Pertinent labs & imaging results that were available during my care of the patient were reviewed by me and considered in my medical decision making (see chart for details).    Plan: 1.  CBC, CMP, Lyme's, RMSF are pending. 2.  Advised patient to continue ibuprofen as needed to help control the pain and the fever. 3.  Advised patient to stay out of the heat for the next several days until this resolves. 4.  Advised patient to follow-up with PCP or return to urgent care if symptoms fail to improve within the next several days. Final Clinical Impressions(s) / UC Diagnoses   Final diagnoses:  Acute cough  Postviral fatigue syndrome  Fever, unspecified  Arthralgia, unspecified joint     Discharge Instructions      Advised to continue using ibuprofen on a regular basis to treat the fever and joint pain. Advised to observe as waiting for the labs to return which takes 48 hours.  I did screen for Lyme's and [facility] spotted fever. Advised to follow-up with PCP or return to urgent care if symptoms fail to improve within the next 2 to 3 days.    ED Prescriptions   None    PDMP not reviewed this encounter.   Nyoka Lint, PA-C 09/28/21 [phone]

## 2021-09-29 LAB — LYME DISEASE SEROLOGY W/REFLEX: Lyme Total Antibody EIA: NEGATIVE

## 2021-09-30 LAB — ROCKY MTN SPOTTED FVR ABS PNL(IGG+IGM)
RMSF IgG: NEGATIVE
RMSF IgM: 1.13 index — ABNORMAL HIGH (ref 0.00–0.89)

## 2021-10-01 ENCOUNTER — Other Ambulatory Visit (HOSPITAL_COMMUNITY): Payer: Self-pay

## 2021-10-01 MED ORDER — DOXYCYCLINE HYCLATE 100 MG PO TABS
100.0000 mg | ORAL_TABLET | Freq: Two times a day (BID) | ORAL | 0 refills | Status: DC
  Filled 2021-10-01: qty 20, 10d supply, fill #0

## 2021-10-03 ENCOUNTER — Telehealth (HOSPITAL_COMMUNITY): Payer: Self-pay | Admitting: Emergency Medicine

## 2021-10-03 NOTE — Telephone Encounter (Signed)
OPened in error.

## 2021-11-22 ENCOUNTER — Other Ambulatory Visit (HOSPITAL_COMMUNITY): Payer: Self-pay

## 2021-11-22 MED ORDER — PANTOPRAZOLE SODIUM 40 MG PO TBEC
DELAYED_RELEASE_TABLET | ORAL | 1 refills | Status: DC
  Filled 2021-11-22: qty 30, 30d supply, fill #0

## 2021-11-22 MED ORDER — ALBUTEROL SULFATE HFA 108 (90 BASE) MCG/ACT IN AERS
INHALATION_SPRAY | RESPIRATORY_TRACT | 1 refills | Status: DC
Start: 2021-11-22 — End: 2023-01-04
  Filled 2021-11-22: qty 6.7, 30d supply, fill #0

## 2021-11-24 ENCOUNTER — Other Ambulatory Visit (HOSPITAL_COMMUNITY): Payer: Self-pay

## 2022-02-03 ENCOUNTER — Other Ambulatory Visit (HOSPITAL_COMMUNITY): Payer: Self-pay

## 2022-02-03 MED ORDER — POLYMYXIN B-TRIMETHOPRIM 10000-0.1 UNIT/ML-% OP SOLN
OPHTHALMIC | 1 refills | Status: DC
  Filled 2022-02-03: qty 10, 10d supply, fill #0

## 2022-09-27 ENCOUNTER — Encounter (HOSPITAL_COMMUNITY): Payer: Self-pay

## 2022-09-27 ENCOUNTER — Other Ambulatory Visit: Payer: Self-pay | Admitting: Gastroenterology

## 2022-09-27 ENCOUNTER — Emergency Department (EMERGENCY_DEPARTMENT_HOSPITAL): Payer: PRIVATE HEALTH INSURANCE | Admitting: Anesthesiology

## 2022-09-27 ENCOUNTER — Other Ambulatory Visit: Payer: Self-pay

## 2022-09-27 ENCOUNTER — Emergency Department (HOSPITAL_COMMUNITY): Payer: PRIVATE HEALTH INSURANCE

## 2022-09-27 ENCOUNTER — Emergency Department (HOSPITAL_COMMUNITY): Payer: PRIVATE HEALTH INSURANCE | Admitting: Anesthesiology

## 2022-09-27 ENCOUNTER — Encounter (HOSPITAL_COMMUNITY)
Admission: EM | Disposition: A | Discharge status: Home / Self Care | Payer: Self-pay | Source: Home / Self Care | Attending: Emergency Medicine

## 2022-09-27 ENCOUNTER — Ambulatory Visit (HOSPITAL_COMMUNITY)
Admission: EM | Admit: 2022-09-27 | Discharge: 2022-09-27 | Disposition: A | Discharge status: Home / Self Care | Payer: PRIVATE HEALTH INSURANCE | Attending: Emergency Medicine | Admitting: Emergency Medicine

## 2022-09-27 DIAGNOSIS — K222 Esophageal obstruction: Secondary | ICD-10-CM | POA: Diagnosis not present

## 2022-09-27 DIAGNOSIS — Z79899 Other long term (current) drug therapy: Secondary | ICD-10-CM | POA: Diagnosis not present

## 2022-09-27 DIAGNOSIS — R09A2 Foreign body sensation, throat: Secondary | ICD-10-CM

## 2022-09-27 DIAGNOSIS — W44F3XA Food entering into or through a natural orifice, initial encounter: Secondary | ICD-10-CM | POA: Diagnosis not present

## 2022-09-27 DIAGNOSIS — T18128A Food in esophagus causing other injury, initial encounter: Secondary | ICD-10-CM | POA: Insufficient documentation

## 2022-09-27 DIAGNOSIS — K200 Eosinophilic esophagitis: Secondary | ICD-10-CM | POA: Insufficient documentation

## 2022-09-27 DIAGNOSIS — K219 Gastro-esophageal reflux disease without esophagitis: Secondary | ICD-10-CM | POA: Diagnosis not present

## 2022-09-27 DIAGNOSIS — K209 Esophagitis, unspecified without bleeding: Secondary | ICD-10-CM

## 2022-09-27 DIAGNOSIS — J45909 Unspecified asthma, uncomplicated: Secondary | ICD-10-CM | POA: Diagnosis not present

## 2022-09-27 DIAGNOSIS — J449 Chronic obstructive pulmonary disease, unspecified: Secondary | ICD-10-CM | POA: Diagnosis not present

## 2022-09-27 DIAGNOSIS — K449 Diaphragmatic hernia without obstruction or gangrene: Secondary | ICD-10-CM

## 2022-09-27 HISTORY — PX: BALLOON DILATION: SHX5330

## 2022-09-27 HISTORY — PX: FOREIGN BODY REMOVAL ESOPHAGEAL: SHX5322

## 2022-09-27 HISTORY — PX: BIOPSY: SHX5522

## 2022-09-27 HISTORY — PX: ESOPHAGOGASTRODUODENOSCOPY (EGD) WITH PROPOFOL: SHX5813

## 2022-09-27 LAB — CBC
HCT: 49.1 % (ref 39.0–52.0)
Hemoglobin: 17.5 g/dL — ABNORMAL HIGH (ref 13.0–17.0)
MCH: 33.1 pg (ref 26.0–34.0)
MCHC: 35.6 g/dL (ref 30.0–36.0)
MCV: 92.8 fL (ref 80.0–100.0)
Platelets: 208 10*3/uL (ref 150–400)
RBC: 5.29 MIL/uL (ref 4.22–5.81)
RDW: 12.4 % (ref 11.5–15.5)
WBC: 7.2 10*3/uL (ref 4.0–10.5)
nRBC: 0 % (ref 0.0–0.2)

## 2022-09-27 LAB — COMPREHENSIVE METABOLIC PANEL
ALT: 46 U/L — ABNORMAL HIGH (ref 0–44)
AST: 37 U/L (ref 15–41)
Albumin: 4.4 g/dL (ref 3.5–5.0)
Alkaline Phosphatase: 62 U/L (ref 38–126)
Anion gap: 13 (ref 5–15)
BUN: 13 mg/dL (ref 6–20)
CO2: 23 mmol/L (ref 22–32)
Calcium: 9.6 mg/dL (ref 8.9–10.3)
Chloride: 103 mmol/L (ref 98–111)
Creatinine, Ser: 1.15 mg/dL (ref 0.61–1.24)
GFR, Estimated: >60 mL/min (ref >60)
Glucose, Bld: 114 mg/dL — ABNORMAL HIGH (ref 70–99)
Potassium: 3.9 mmol/L (ref 3.5–5.1)
Sodium: 139 mmol/L (ref 135–145)
Total Bilirubin: 0.6 mg/dL (ref 0.3–1.2)
Total Protein: 7.5 g/dL (ref 6.5–8.1)

## 2022-09-27 LAB — LIPASE, BLOOD: Lipase: 97 U/L — ABNORMAL HIGH (ref 11–51)

## 2022-09-27 SURGERY — ESOPHAGOGASTRODUODENOSCOPY (EGD) WITH PROPOFOL
Anesthesia: General

## 2022-09-27 MED ORDER — DEXAMETHASONE SODIUM PHOSPHATE 10 MG/ML IJ SOLN
INTRAMUSCULAR | Status: DC | PRN
  Administered 2022-09-27: 10 mg via INTRAVENOUS

## 2022-09-27 MED ORDER — GLUCAGON HCL RDNA (DIAGNOSTIC) 1 MG IJ SOLR
1.0000 mg | Freq: Once | INTRAMUSCULAR | Status: AC
  Administered 2022-09-27: 1 mg via INTRAVENOUS
  Filled 2022-09-27: qty 1

## 2022-09-27 MED ORDER — PROPOFOL 500 MG/50ML IV EMUL
INTRAVENOUS | Status: DC | PRN
  Administered 2022-09-27: 125 ug/kg/min via INTRAVENOUS

## 2022-09-27 MED ORDER — MIDAZOLAM HCL 2 MG/2ML IJ SOLN
INTRAMUSCULAR | Status: AC
  Filled 2022-09-27: qty 2

## 2022-09-27 MED ORDER — SUCCINYLCHOLINE CHLORIDE 200 MG/10ML IV SOSY
PREFILLED_SYRINGE | INTRAVENOUS | Status: DC | PRN
  Administered 2022-09-27: 180 mg via INTRAVENOUS

## 2022-09-27 MED ORDER — PANTOPRAZOLE SODIUM 40 MG PO TBEC
40.0000 mg | DELAYED_RELEASE_TABLET | Freq: Two times a day (BID) | ORAL | 0 refills | Status: DC
Start: 2022-09-27 — End: 2023-01-03
  Filled 2022-09-27 – 2022-09-28 (×2): qty 120, 60d supply, fill #0

## 2022-09-27 MED ORDER — OXYCODONE HCL 5 MG PO TABS: 5.0000 mg | ORAL_TABLET | Freq: Once | ORAL | Status: DC | PRN

## 2022-09-27 MED ORDER — ONDANSETRON HCL 4 MG/2ML IJ SOLN
INTRAMUSCULAR | Status: DC | PRN
  Administered 2022-09-27: 4 mg via INTRAVENOUS

## 2022-09-27 MED ORDER — OXYCODONE HCL 5 MG/5ML PO SOLN: 5.0000 mg | Freq: Once | ORAL | Status: DC | PRN

## 2022-09-27 MED ORDER — ONDANSETRON HCL 4 MG/2ML IJ SOLN
4.0000 mg | Freq: Once | INTRAMUSCULAR | Status: DC | PRN
  Filled 2022-09-27: qty 2

## 2022-09-27 MED ORDER — FENTANYL CITRATE PF 50 MCG/ML IJ SOSY
50.0000 ug | PREFILLED_SYRINGE | Freq: Once | INTRAMUSCULAR | Status: AC
  Administered 2022-09-27: 50 ug via INTRAVENOUS
  Filled 2022-09-27: qty 1

## 2022-09-27 MED ORDER — ACETAMINOPHEN 10 MG/ML IV SOLN
1000.0000 mg | Freq: Once | INTRAVENOUS | Status: AC
  Administered 2022-09-27: 1000 mg via INTRAVENOUS
  Filled 2022-09-27: qty 100

## 2022-09-27 MED ORDER — FENTANYL CITRATE (PF) 100 MCG/2ML IJ SOLN: 25.0000 ug | INTRAMUSCULAR | Status: DC | PRN

## 2022-09-27 MED ORDER — FENTANYL CITRATE (PF) 100 MCG/2ML IJ SOLN
INTRAMUSCULAR | Status: AC
  Filled 2022-09-27: qty 2

## 2022-09-27 MED ORDER — LIDOCAINE 2% (20 MG/ML) 5 ML SYRINGE
INTRAMUSCULAR | Status: DC | PRN
  Administered 2022-09-27: 100 mg via INTRAVENOUS

## 2022-09-27 MED ORDER — PROPOFOL 10 MG/ML IV BOLUS
INTRAVENOUS | Status: DC | PRN
  Administered 2022-09-27: 150 mg via INTRAVENOUS

## 2022-09-27 MED ORDER — LACTATED RINGERS IV BOLUS
1000.0000 mL | Freq: Once | INTRAVENOUS | Status: AC
  Administered 2022-09-27: 1000 mL via INTRAVENOUS

## 2022-09-27 MED ORDER — ONDANSETRON HCL 4 MG/2ML IJ SOLN
4.0000 mg | Freq: Once | INTRAMUSCULAR | Status: AC
  Administered 2022-09-27: 4 mg via INTRAVENOUS

## 2022-09-27 MED ORDER — PROMETHAZINE HCL 25 MG/ML IJ SOLN: 6.2500 mg | INTRAMUSCULAR | Status: DC | PRN

## 2022-09-27 MED ORDER — MEPERIDINE HCL 25 MG/ML IJ SOLN: 6.2500 mg | INTRAMUSCULAR | Status: DC | PRN

## 2022-09-27 MED ORDER — PANTOPRAZOLE SODIUM 40 MG IV SOLR
40.0000 mg | Freq: Once | INTRAVENOUS | Status: AC
  Administered 2022-09-27: 40 mg via INTRAVENOUS
  Filled 2022-09-27: qty 10

## 2022-09-27 MED ORDER — FENTANYL CITRATE (PF) 250 MCG/5ML IJ SOLN
INTRAMUSCULAR | Status: DC | PRN
  Administered 2022-09-27: 100 ug via INTRAVENOUS

## 2022-09-27 MED ORDER — MIDAZOLAM HCL 2 MG/2ML IJ SOLN
0.5000 mg | Freq: Once | INTRAMUSCULAR | Status: AC | PRN
  Administered 2022-09-27: 2 mg via INTRAVENOUS

## 2022-09-27 MED ORDER — LACTATED RINGERS IV SOLN: INTRAVENOUS | Status: DC | PRN

## 2022-09-27 SURGICAL SUPPLY — 15 items

## 2022-09-27 NOTE — ED Notes (Signed)
Pt transported to endo.  

## 2022-09-27 NOTE — ED Provider Notes (Signed)
[place] EMERGENCY DEPARTMENT AT [facility] Provider Note   CSN: [id number] Arrival date & time: 09/27/22  1425     History  Chief Complaint  Patient presents with   Abdominal Pain    Frank [name] is a 52 y.o. male.  With a history of asthma, GERD who presents to the ED for evaluation of epigastric abdominal pain.  This began a couple hours ago shortly after eating.  He states he was eating fast at work and feels like some food got stuck in his lower esophagus. He was eating chicken and macaroni and cheese.  This has happened to him between 7 and 10 times in the past.  States he has very bad GERD and has a family history of Barrett's esophagitis.  He has a burning pain when he swallows.  States the pain has improved since he got here but is still present.  He has performed physical activity in the past with some improvement but states that has not helped today.  States he feels like he has to belch but nothing will come out.  He denies any chest pain.  No shortness of breath or cough.  No cardiac history.  He had a stress test less than a year ago that was normal.  Does not believe this is cardiac related.  Strongly believes this is a food impaction.   Abdominal Pain Associated symptoms: nausea        Home Medications Prior to Admission medications   Medication Sig Start Date End Date Taking? Authorizing Provider  albuterol (VENTOLIN HFA) 108 (90 Base) MCG/ACT inhaler Inhale 1-2 puffs into the lungs every 6 (six) hours as needed for wheezing or shortness of breath.    [provider]  albuterol (VENTOLIN HFA) 108 (90 Base) MCG/ACT inhaler Inhale 1 puff by mouth into the lungs every 4 hours 11/22/21     calcium carbonate (TUMS - DOSED IN MG ELEMENTAL CALCIUM) 500 MG chewable tablet Chew 1 tablet by mouth daily.    [provider]  cetirizine (ZYRTEC) 5 MG tablet Take 5 mg by mouth daily. Patient not taking: Reported on 09/28/2021    [provider]  doxycycline (VIBRA-TABS) 100 MG tablet Take 1 tablet (100 mg total) by mouth 2 (two) times daily for 10 days 10/01/21   Lowanda Foster, NP  ibuprofen (ADVIL) 800 MG tablet Take 1 tablet (800 mg total) by mouth 3 (three) times daily. 09/28/21   Ellsworth Lennox, PA-C  mupirocin ointment (BACTROBAN) 2 % Apply 1 application topically 2 (two) times daily for 5 days Patient not taking: Reported on 09/28/2021 06/20/21   Lowanda Foster, NP  pantoprazole (PROTONIX) 40 MG tablet 1 tablet Patient not taking: Reported on 09/28/2021 02/18/21   [provider]  pantoprazole (PROTONIX) 40 MG tablet Take 1 tablet by mouth once daily 03/18/21     pantoprazole (PROTONIX) 40 MG tablet Take 1 tablet mouth once daily 11/22/21     trimethoprim-polymyxin b (POLYTRIM) ophthalmic solution Place 1-2 drops in both eyes 4 times a day. 02/02/22   Charlett Nose, MD      Allergies    Peanut (diagnostic)    Review of Systems   Review of Systems  Gastrointestinal:  Positive for abdominal pain and nausea.  All other systems reviewed and are negative.   Physical Exam Updated Vital Signs BP 134/75 (BP Location: Right Arm)   Pulse 76   Temp 98.7 F (37.1 C) (Oral)   Resp 19  Ht 6\' 4"  (1.93 m)   Wt 97.1 kg   SpO2 100%   BMI 26.05 kg/m  Physical Exam Vitals and nursing note reviewed.  Constitutional:      General: He is in acute distress.     Appearance: He is well-developed. He is diaphoretic.     Comments: Spitting into an emesis bag  HENT:     Head: Normocephalic and atraumatic.  Eyes:     Conjunctiva/sclera: Conjunctivae normal.  Cardiovascular:     Rate and Rhythm: Normal rate and regular rhythm.     Heart sounds: No murmur heard. Pulmonary:     Effort: Pulmonary effort is normal. No respiratory distress.     Breath sounds: Normal breath sounds. No wheezing, rhonchi or rales.  Abdominal:     General: Abdomen is flat.     Palpations: Abdomen is soft.     Tenderness: There is no abdominal  tenderness.  Musculoskeletal:        General: No swelling.     Cervical back: Neck supple.  Skin:    General: Skin is warm.     Capillary Refill: Capillary refill takes less than 2 seconds.  Neurological:     Mental Status: He is alert.  Psychiatric:        Mood and Affect: Mood normal.     ED Results / Procedures / Treatments   Labs (all labs ordered are listed, but only abnormal results are displayed) Labs Reviewed  LIPASE, BLOOD - Abnormal; Notable for the following components:      Result Value   Lipase 97 (*)    All other components within normal limits  COMPREHENSIVE METABOLIC PANEL - Abnormal; Notable for the following components:   Glucose, Bld 114 (*)    ALT 46 (*)    All other components within normal limits  CBC - Abnormal; Notable for the following components:   Hemoglobin 17.5 (*)    All other components within normal limits  URINALYSIS, ROUTINE W REFLEX MICROSCOPIC    EKG None  Radiology DG Chest 2 View  Result Date: 09/27/2022 CLINICAL DATA:  food bolus EXAM: CHEST - 2 VIEW COMPARISON:  07/05/2021. FINDINGS: Bilateral lung fields are clear. Bilateral costophrenic angles are clear. No free air under the domes of diaphragm. Normal cardio-mediastinal silhouette. No acute osseous abnormalities. The soft tissues are within normal limits. IMPRESSION: No active cardiopulmonary disease. Electronically Signed   By: Jules Schick M.D.   On: 09/27/2022 15:21    Procedures Procedures    Medications Ordered in ED Medications  ondansetron (ZOFRAN) injection 4 mg (4 mg Intravenous Given 09/27/22 1443)  glucagon (human recombinant) (GLUCAGEN) injection 1 mg (1 mg Intravenous Given 09/27/22 1507)  pantoprazole (PROTONIX) injection 40 mg (40 mg Intravenous Given 09/27/22 1522)    ED Course/ Medical Decision Making/ A&P Clinical Course as of 09/27/22 1532  Wed Sep 27, 2022  1519 GI  [WF]    Clinical Course User Index [WF] Gailen Shelter, PA                              Medical Decision Making Amount and/or Complexity of Data Reviewed Labs: ordered.  Risk Prescription drug management.  This patient presents to the ED for concern of globus sensation, this involves an extensive number of treatment options, and is a complaint that carries with it a high risk of complications and morbidity.  The differential diagnosis includes globus pharyngeus, ACS,  PE, pneumothorax, esophageal perforation  My initial workup includes labs, glucagon, CXR  Additional history obtained from: Nursing notes from this visit.  I ordered, reviewed and interpreted labs which include: CBC, CMP, lipase  Afebrile, hemodynamically stable.  52 year old male presenting to the ED for evaluation of globus sensation.  This has happened to him before but never to this extent.  He reports pain when he swallows.  Does not believe it is cardiac in nature.  No shortness of breath.  He appears uncomfortable on physical exam.  He has normal breath sounds.  No stridor. Glucagon was given without improvement in his symptoms. Care at the time of shift change is to consult GI for evaluation. Care will be handed off to oncoming provider. Please see his note for final disposition and decision making.   Patient's case discussed with Dr. Lynelle Doctor.   Note: Portions of this report may have been transcribed using voice recognition software. Every effort was made to ensure accuracy; however, inadvertent computerized transcription errors may still be present.        Final Clinical Impression(s) / ED Diagnoses Final diagnoses:  None    Rx / DC Orders ED Discharge Orders     None         Mora Bellman 09/27/22 1532    Linwood Dibbles, MD 09/28/22 1038

## 2022-09-27 NOTE — Discharge Instructions (Signed)
YOU HAD AN ENDOSCOPIC PROCEDURE TODAY: Refer to the procedure report and other information in the discharge instructions given to you for any specific questions about what was found during the examination. If this information does not answer your questions, please call [place] office at [phone] to clarify.   YOU SHOULD EXPECT: Some feelings of bloating in the abdomen. Passage of more gas than usual. Walking can help get rid of the air that was put into your GI tract during the procedure and reduce the bloating. If you had a lower endoscopy (such as a colonoscopy or flexible sigmoidoscopy) you may notice spotting of blood in your stool or on the toilet paper. Some abdominal soreness may be present for a day or two, also.  DIET: Your first meal following the procedure should be a light meal and then it is ok to progress to your normal diet. A half-sandwich or bowl of soup is an example of a good first meal. Heavy or fried foods are harder to digest and may make you feel nauseous or bloated. Drink plenty of fluids but you should avoid alcoholic beverages for 24 hours. If you had a esophageal dilation, please see attached instructions for diet.    ACTIVITY: Your care partner should take you home directly after the procedure. You should plan to take it easy, moving slowly for the rest of the day. You can resume normal activity the day after the procedure however YOU SHOULD NOT DRIVE, use power tools, machinery or perform tasks that involve climbing or major physical exertion for 24 hours (because of the sedation medicines used during the test).   SYMPTOMS TO REPORT IMMEDIATELY: A gastroenterologist can be reached at any hour. Please call [phone]  for any of the following symptoms:   Following upper endoscopy (EGD, EUS, ERCP, esophageal dilation) Vomiting of blood or coffee ground material  New, significant abdominal pain  New, significant chest pain or pain under the shoulder blades  Painful or  persistently difficult swallowing  New shortness of breath  Black, tarry-looking or red, bloody stools  FOLLOW UP:  If any biopsies were taken you will be contacted by phone or by letter within the next 1-3 weeks. Call [phone]  if you have not heard about the biopsies in 3 weeks.  Please also call with any specific questions about appointments or follow up tests.  

## 2022-09-27 NOTE — ED Provider Notes (Signed)
  Physical Exam  BP (!) 116/55 (BP Location: Right Arm)   Pulse 79   Temp 98.3 F (36.8 C)   Resp 20   Ht 6\' 4"  (1.93 m)   Wt 97.1 kg   SpO2 96%   BMI 26.05 kg/m   Physical Exam  Procedures  Procedures  ED Course / MDM    Medical Decision Making Amount and/or Complexity of Data Reviewed Labs: ordered. Radiology: ordered.  Risk Prescription drug management.   Consult to gastroenterology who will take to endoscopy       Gailen Shelter, PA 09/27/22 1909    Loetta Rough, MD 09/27/22 [phone]

## 2022-09-27 NOTE — Consult Note (Signed)
Consultation  Referring Provider:   Dr. Lula Olszewski   Primary Care Physician:  Tally Joe, MD Primary Gastroenterologist:    N/A     Reason for Consultation:     Esophageal food impaction         HPI:   Frank [name] is a 52 y.o. male with a history of GERD and asthma who presented to the ED today with esophageal food impaction.  He was eating chicken and macaroni and cheese when he felt food get stuck after a few bites.  He has not been able to bring up any of the swallowed food.  He is not tolerating secretions. He has a history of recurrent dysphagia, which has always spontaneously resolved in the past.  He has never had an upper endoscopy. He has a history of chronic GERD symptoms for the past few years.  He typically has symptoms of heartburn or acid reflux a few times a week.  He takes famotidine on occasion, sometimes antacids. His father has Barrett's and significant GERD. In the ED he had a normal chest x-ray.  He was given Zofran, glucagon and Protonix.  Past Medical History:  Diagnosis Date   Allergic rhinitis    Asthma    Cat bite     Past Surgical History:  Procedure Laterality Date   none      Family History  Problem Relation Age of Onset   Multiple myeloma Father    Diabetes Father    Hypertension Father    Diabetes Maternal Grandmother    CVA Maternal Grandmother    Prostate cancer Maternal Grandfather    Cancer Paternal Grandfather    Colon cancer Neg Hx      Social History   Tobacco Use   Smoking status: Never   Smokeless tobacco: Never  Vaping Use   Vaping Use: Never used  Substance Use Topics   Alcohol use: Yes   Drug use: No    Prior to Admission medications   Medication Sig Start Date End Date Taking? Authorizing Provider  albuterol (VENTOLIN HFA) 108 (90 Base) MCG/ACT inhaler Inhale 1-2 puffs into the lungs every 6 (six) hours as needed for wheezing or shortness of breath.    [provider]  albuterol (VENTOLIN HFA) 108 (90  Base) MCG/ACT inhaler Inhale 1 puff by mouth into the lungs every 4 hours 11/22/21     calcium carbonate (TUMS - DOSED IN MG ELEMENTAL CALCIUM) 500 MG chewable tablet Chew 1 tablet by mouth daily.    [provider]  cetirizine (ZYRTEC) 5 MG tablet Take 5 mg by mouth daily. Patient not taking: Reported on 09/28/2021    [provider]  doxycycline (VIBRA-TABS) 100 MG tablet Take 1 tablet (100 mg total) by mouth 2 (two) times daily for 10 days 10/01/21   Lowanda Foster, NP  ibuprofen (ADVIL) 800 MG tablet Take 1 tablet (800 mg total) by mouth 3 (three) times daily. 09/28/21   Ellsworth Lennox, PA-C  mupirocin ointment (BACTROBAN) 2 % Apply 1 application topically 2 (two) times daily for 5 days Patient not taking: Reported on 09/28/2021 06/20/21   Lowanda Foster, NP  pantoprazole (PROTONIX) 40 MG tablet 1 tablet Patient not taking: Reported on 09/28/2021 02/18/21   [provider]  pantoprazole (PROTONIX) 40 MG tablet Take 1 tablet by mouth once daily 03/18/21     pantoprazole (PROTONIX) 40 MG tablet Take 1 tablet mouth once daily 11/22/21     trimethoprim-polymyxin b (POLYTRIM) ophthalmic solution Place 1-2  drops in both eyes 4 times a day. 02/02/22   Charlett Nose, MD    Current Facility-Administered Medications  Medication Dose Route Frequency Provider Last Rate Last Admin   fentaNYL (SUBLIMAZE) injection 25-50 mcg  25-50 mcg Intravenous Q5 min PRN Jairo Ben, MD       meperidine (DEMEROL) injection 6.25-12.5 mg  6.25-12.5 mg Intravenous Q5 min PRN Jairo Ben, MD       midazolam (VERSED) injection 0.5-2 mg  0.5-2 mg Intravenous Once PRN Jairo Ben, MD       oxyCODONE (Oxy IR/ROXICODONE) immediate release tablet 5 mg  5 mg Oral Once PRN Jairo Ben, MD       Or   oxyCODONE (ROXICODONE) 5 MG/5ML solution 5 mg  5 mg Oral Once PRN Jairo Ben, MD       promethazine (PHENERGAN) injection 6.25-12.5 mg  6.25-12.5 mg Intravenous Q15 min PRN Jairo Ben, MD        Allergies as of 09/27/2022 - Review Complete 09/27/2022  Allergen Reaction Noted   Peanut (diagnostic)  02/18/2021     Review of Systems:    As per HPI, otherwise negative    Physical Exam:  Vital signs in last 24 hours: Temp:  [97.8 F (36.6 C)-98.7 F (37.1 C)] 97.8 F (36.6 C) (07/10 1643) Pulse Rate:  [73-76] 73 (07/10 1643) Resp:  [12-19] 12 (07/10 1643) BP: (131-134)/(75-80) 131/80 (07/10 1643) SpO2:  [99 %-100 %] 99 % (07/10 1643) Weight:  [97.1 kg] 97.1 kg (07/10 1431)   General:   Pleasant Caucasian male, appears uncomfortable, spitting into emesis bag every few seconds, frequently hiccuping Head:  Normocephalic and atraumatic. Eyes:   No icterus.   Conjunctiva pink. Ears:  Normal auditory acuity. Neck:  Supple Lungs:  Respirations even and unlabored. Lungs clear to auscultation bilaterally.   No wheezes, crackles, or rhonchi.  Heart:  Regular rate and rhythm; no MRG Abdomen:  Soft, nondistended, nontender. Normal bowel sounds. No appreciable masses or hepatomegaly.  Rectal:  Not performed.  Msk:  Symmetrical without gross deformities.  Extremities:  Without edema. Neurologic:  Alert and  oriented x4;  grossly normal neurologically. Skin:  Intact without significant lesions or rashes. Psych:  Alert and cooperative. Normal affect.  LAB RESULTS: Recent Labs    09/27/22 1430  WBC 7.2  HGB 17.5*  HCT 49.1  PLT 208   BMET Recent Labs    09/27/22 1430  NA 139  K 3.9  CL 103  CO2 23  GLUCOSE 114*  BUN 13  CREATININE 1.15  CALCIUM 9.6   LFT Recent Labs    09/27/22 1430  PROT 7.5  ALBUMIN 4.4  AST 37  ALT 46*  ALKPHOS 62  BILITOT 0.6   PT/INR No results for input(s): "LABPROT", "INR" in the last 72 hours.  STUDIES: DG Chest 2 View  Result Date: 09/27/2022 CLINICAL DATA:  food bolus EXAM: CHEST - 2 VIEW COMPARISON:  07/05/2021. FINDINGS: Bilateral lung fields are clear. Bilateral costophrenic angles are clear. No free  air under the domes of diaphragm. Normal cardio-mediastinal silhouette. No acute osseous abnormalities. The soft tissues are within normal limits. IMPRESSION: No active cardiopulmonary disease. Electronically Signed   By: Jules Schick M.D.   On: 09/27/2022 15:21     PREVIOUS ENDOSCOPIES:            Colonoscopy May 29, 2017 (Dr. Lavon Paganini) 5 mm rectal polyp, left-sided diverticulosis, otherwise normal   Impression / Plan:   52 year old male with  history of uncontrolled GERD, with occasional dysphagia, now presenting with acute esophageal food impaction.  Very high suspicion for peptic stricture from GERD.  Will perform emergent upper endoscopy with disimpaction.  The details, risks (including bleeding, perforation, infection, missed lesions, medication reactions and possible hospitalization or surgery if complications occur), benefits, and alternatives to EGD with possible biopsy and possible dilation were discussed with the patient and he consents to proceed.   Esophageal food impaction - Emergent EGD   Thanks   LOS: 0 days   Jenel Lucks  09/27/2022, 5:11 PM

## 2022-09-27 NOTE — Anesthesia Procedure Notes (Signed)
Procedure Name: Intubation Date/Time: 09/27/2022 5:25 PM  Performed by: Cheree Ditto, CRNAPre-anesthesia Checklist: Patient identified, Emergency Drugs available, Suction available and Patient being monitored Patient Re-evaluated:Patient Re-evaluated prior to induction Oxygen Delivery Method: Circle system utilized Preoxygenation: Pre-oxygenation with 100% oxygen Induction Type: IV induction, Rapid sequence and Cricoid Pressure applied Laryngoscope Size: Mac Grade View: Grade I Tube type: Oral Number of attempts: 1 Airway Equipment and Method: Stylet and Oral airway Placement Confirmation: ETT inserted through vocal cords under direct vision, positive ETCO2 and breath sounds checked- equal and bilateral Secured at: 24 cm Tube secured with: Tape Dental Injury: Teeth and Oropharynx as per pre-operative assessment

## 2022-09-27 NOTE — Anesthesia Preprocedure Evaluation (Addendum)
Anesthesia Evaluation  Patient identified by MRN, date of birth, ID band Patient awake    Reviewed: Allergy & Precautions, NPO status , Patient's Chart, lab work & pertinent test results  History of Anesthesia Complications Negative for: history of anesthetic complications  Airway Mallampati: II  TM Distance: >3 FB Neck ROM: Full    Dental  (+) Dental Advisory Given   Pulmonary asthma , COPD,  COPD inhaler   breath sounds clear to auscultation       Cardiovascular negative cardio ROS  Rhythm:Regular Rate:Normal  '23 Exercise Stress: normal   Neuro/Psych negative neurological ROS     GI/Hepatic Neg liver ROS,GERD  Medicated and Poorly Controlled,,Chicken impaction   Endo/Other  negative endocrine ROS    Renal/GU negative Renal ROS     Musculoskeletal   Abdominal   Peds  Hematology negative hematology ROS (+)   Anesthesia Other Findings   Reproductive/Obstetrics                             Anesthesia Physical Anesthesia Plan  ASA: 2 and emergent  Anesthesia Plan: General   Post-op Pain Management: Minimal or no pain anticipated   Induction: Intravenous and Rapid sequence  PONV Risk Score and Plan: 2 and Ondansetron and Dexamethasone  Airway Management Planned: Oral ETT  Additional Equipment: None  Intra-op Plan:   Post-operative Plan: Extubation in OR  Informed Consent: I have reviewed the patients History and Physical, chart, labs and discussed the procedure including the risks, benefits and alternatives for the proposed anesthesia with the patient or authorized representative who has indicated his/her understanding and acceptance.     Dental advisory given  Plan Discussed with: CRNA and Surgeon  Anesthesia Plan Comments:         Anesthesia Quick Evaluation

## 2022-09-27 NOTE — Transfer of Care (Signed)
Immediate Anesthesia Transfer of Care Note  Patient: Derran Sear [facility]  Procedure(s) Performed: ESOPHAGOGASTRODUODENOSCOPY (EGD) WITH PROPOFOL BALLOON DILATION BIOPSY REMOVAL FOREIGN BODY ESOPHAGEAL  Patient Location: PACU  Anesthesia Type:General  Level of Consciousness: awake, alert , and oriented  Airway & Oxygen Therapy: Patient Spontanous Breathing  Post-op Assessment: Report given to RN and Post -op Vital signs reviewed and stable  Post vital signs: Reviewed and stable  Last Vitals:  Vitals Value Taken Time  BP 107/64 09/27/22 1754  Temp 36.8 C 09/27/22 1754  Pulse 81 09/27/22 1759  Resp 12 09/27/22 1759  SpO2 95 % 09/27/22 1759  Vitals shown include unvalidated device data.  Last Pain:  Vitals:   09/27/22 1754  TempSrc:   PainSc: 2          Complications: No notable events documented.

## 2022-09-27 NOTE — ED Triage Notes (Signed)
Reports when he swallows its terrible pain and feels like he needs to blech but can't Patient is clammy and conitnues to spit saliva out.  Reports it feels like severe acid reflux.  Has gallbladder still.

## 2022-09-27 NOTE — Anesthesia Postprocedure Evaluation (Signed)
Anesthesia Post Note  Patient: Frank [name] [facility]  Procedure(s) Performed: ESOPHAGOGASTRODUODENOSCOPY (EGD) WITH PROPOFOL BALLOON DILATION BIOPSY REMOVAL FOREIGN BODY ESOPHAGEAL     Patient location during evaluation: PACU Anesthesia Type: General Level of consciousness: awake and alert, patient cooperative and oriented Pain management: pain level controlled Vital Signs Assessment: post-procedure vital signs reviewed and stable Respiratory status: spontaneous breathing, nonlabored ventilation and respiratory function stable Cardiovascular status: blood pressure returned to baseline and stable Postop Assessment: no apparent nausea or vomiting and adequate PO intake Anesthetic complications: no   No notable events documented.  Last Vitals:  Vitals:   09/27/22 1754 09/27/22 1800  BP:  (!) 99/50  Pulse:  81  Resp:  12  Temp: 36.8 C   SpO2:  95%    Last Pain:  Vitals:   09/27/22 1754  TempSrc:   PainSc: 2                  Jamyra Zweig,E. Mathayus Stanbery

## 2022-09-27 NOTE — Op Note (Signed)
[facility] Patient Name: Frank [name] Procedure Date : 09/27/2022 MRN: [id number] Attending MD: Dub Amis. Frank [name] , MD, [id number] Date of Birth: 1970/09/18 CSN: [id number] Age: 52 Admit Type: Outpatient Procedure:                Upper GI endoscopy Indications:              Foreign body in the esophagus Providers:                Lorin Picket E. Frank Rand, MD, Doristine Mango, RN,                            Salley Scarlet, Technician Referring MD:              Medicines:                General Anesthesia Complications:            No immediate complications. Estimated Blood Loss:     Estimated blood loss was minimal. Procedure:                Pre-Anesthesia Assessment:                           - Prior to the procedure, a History and Physical                            was performed, and patient medications and                            allergies were reviewed. The patient's tolerance of                            previous anesthesia was also reviewed. The risks                            and benefits of the procedure and the sedation                            options and risks were discussed with the patient.                            All questions were answered, and informed consent                            was obtained. Prior Anticoagulants: The patient has                            taken no anticoagulant or antiplatelet agents. ASA                            Grade Assessment: II - A patient with mild systemic                            disease. After reviewing the risks and benefits,  the patient was deemed in satisfactory condition to                            undergo the procedure.                           After obtaining informed consent, the endoscope was                            passed under direct vision. Throughout the                            procedure, the patient's blood pressure, pulse, and                             oxygen saturations were monitored continuously. The                            GIF-H190 ([id number] Olympus endoscope was introduced                            through the mouth, and advanced to the second part                            of duodenum. The upper GI endoscopy was                            accomplished without difficulty. The patient                            tolerated the procedure well. Scope In: Scope Out: Findings:      Food was found in the lower third of the esophagus. Removal was       accomplished with a gentle downward pressure, dislodging the bolus into       the stomach. Estimated blood loss was minimal.      Mildly severe esophagitis characterized by patchy erythema and some       areas of cobblestoning/furrows with no bleeding was found in the       lower/middle third of the esophagus.      One benign-appearing, intrinsic mild stenosis was found at the       gastroesophageal junction. This stenosis measured 1.3 cm (inner       diameter) x less than one cm (in length). The stenosis was traversed. A       TTS dilator was passed through the scope. Dilation with a 15-16.5-18 mm       balloon dilator was performed to 18 mm (dilation to 15 and 16.5 did not       create any resistance). The dilation site was examined and showed mild       mucosal disruption. Estimated blood loss was minimal.      The exam of the esophagus was otherwise normal.      Biopsies were obtained from the proximal and distal esophagus with cold       forceps for histology of suspected eosinophilic esophagitis. Estimated       blood loss was minimal.  A 3 cm hiatal hernia was present.      The exam of the stomach was otherwise normal.      The examined duodenum was normal. Impression:               - Food in the lower third of the esophagus. Removal                            was successful.                           - Mildly severe esophagitis with no bleeding.                            - Benign-appearing esophageal stenosis. Dilated.                           - 3 cm hiatal hernia.                           - Normal examined duodenum.                           - Biopsies were taken with a cold forceps for                            evaluation of eosinophilic esophagitis.                           - Endoscopic findings are most consistent with GERD                            with peptic stricture. EoE unlikely. Moderate Sedation:      N/A Recommendation:           - Patient has a contact number available for                            emergencies. The signs and symptoms of potential                            delayed complications were discussed with the                            patient. Return to normal activities tomorrow.                            Written discharge instructions were provided to the                            patient.                           - Full liquid diet today.                           - Use Protonix (pantoprazole) 40 mg PO BID for 8  weeks, then once daily.                           - Await pathology results.                           - Avoid chewy meats such as chicken, steak or pork                           - Be sure to chew food carefully and thoroughly,                            taking small bites, to avoid future impactions. Procedure Code(s):        --- Professional ---                           [phone], Esophagogastroduodenoscopy, flexible,                            transoral; with removal of foreign body(s)                           43249, Esophagogastroduodenoscopy, flexible,                            transoral; with transendoscopic balloon dilation of                            esophagus (less than 30 mm diameter) Diagnosis Code(s):        --- Professional ---                           U04.540J, Food in esophagus causing other injury,                            initial encounter                            K20.90, Esophagitis, unspecified without bleeding                           K22.2, Esophageal obstruction                           K44.9, Diaphragmatic hernia without obstruction or                            gangrene                           T18.108A, Unspecified foreign body in esophagus                            causing other injury, initial encounter CPT copyright 2022 [employer]. All rights reserved. The codes documented in this report are preliminary and upon coder review may  be revised to meet current compliance requirements. Frank Meckel E. Frank Rand,  MD 09/27/2022 5:59:39 PM This report has been signed electronically. Number of Addenda: 0

## 2022-09-28 ENCOUNTER — Other Ambulatory Visit (HOSPITAL_COMMUNITY): Payer: Self-pay

## 2022-09-28 ENCOUNTER — Encounter (HOSPITAL_COMMUNITY): Payer: Self-pay | Admitting: Gastroenterology

## 2022-09-29 LAB — SURGICAL PATHOLOGY

## 2022-10-03 NOTE — Progress Notes (Signed)
Frank [name],  The biopsies taken from your esophagus showed significant inflammation involving both eosinophils and neutrophils.  Although the eosinophil count was high enough to meet criteria for eosinophilic esophagitis, reflux esophagitis can also be associated with elevated eosinophils.  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 Please take the pantoprazole 40 mg twice daily for 8 weeks, then once daily indefinitely.  I would like to repeat an upper endoscopy sometime after 8 weeks to repeat biopsies and potentially repeat dilation if you are still having any swallowing issues. We will contact you to schedule a repeat upper endoscopy.

## 2022-10-05 ENCOUNTER — Other Ambulatory Visit: Payer: Self-pay

## 2022-10-05 DIAGNOSIS — K222 Esophageal obstruction: Secondary | ICD-10-CM

## 2022-11-07 ENCOUNTER — Other Ambulatory Visit (HOSPITAL_BASED_OUTPATIENT_CLINIC_OR_DEPARTMENT_OTHER): Payer: Self-pay

## 2022-11-17 ENCOUNTER — Encounter: Payer: Self-pay | Admitting: Gastroenterology

## 2022-11-28 ENCOUNTER — Telehealth: Payer: Self-pay | Admitting: Gastroenterology

## 2022-11-28 NOTE — Telephone Encounter (Signed)
Spoke with pt and let him know Dr. Tomasa Rand wanted him to be on Protonix BID for 8 weeks and then daily.He wanted to do the EGD to repeat biopsies after he had been on the medication. Pt verbalized understanding and will keep appt as scheduled.

## 2022-11-28 NOTE — Telephone Encounter (Signed)
PT is scheduled for an EGD on 9/13 and wants to speak with nurse about whether or not the procedure is necessary. Please advise.

## 2022-12-01 ENCOUNTER — Ambulatory Visit: Payer: PRIVATE HEALTH INSURANCE | Admitting: Gastroenterology

## 2022-12-01 ENCOUNTER — Encounter: Payer: Self-pay | Admitting: Gastroenterology

## 2022-12-01 VITALS — BP 103/70 | HR 60 | Temp 99.1°F | Resp 13 | Ht 76.0 in | Wt 212.0 lb

## 2022-12-01 DIAGNOSIS — K222 Esophageal obstruction: Secondary | ICD-10-CM

## 2022-12-01 DIAGNOSIS — K229 Disease of esophagus, unspecified: Secondary | ICD-10-CM | POA: Diagnosis not present

## 2022-12-01 DIAGNOSIS — K200 Eosinophilic esophagitis: Secondary | ICD-10-CM

## 2022-12-01 DIAGNOSIS — K2289 Other specified disease of esophagus: Secondary | ICD-10-CM | POA: Diagnosis not present

## 2022-12-01 DIAGNOSIS — K449 Diaphragmatic hernia without obstruction or gangrene: Secondary | ICD-10-CM

## 2022-12-01 MED ORDER — SODIUM CHLORIDE 0.9 % IV SOLN: 500.0000 mL | Freq: Once | INTRAVENOUS | Status: AC

## 2022-12-01 NOTE — Op Note (Signed)
Frank [name] Patient Name: Frank [name] Procedure Date: 12/01/2022 10:19 AM MRN: [id number] Endoscopist: Frank [name] Frank [name] , MD, [id number] Age: 52 Referring MD:  Date of Birth: 09-24-1970 Gender: Male Account #: [id number] Procedure:                Upper GI endoscopy Indications:              Dysphagia, Exclusion of eosinophilic esophagitis.                            Patient had esophageal food impaction in July,                            found to have distal stricture dilated to 18 mm.                            Biopsies of the eosphagus showed elevated                            eosinophils in distal and proximal esophagus.                            Patient has been taking Protonix BID since then                            with resolution of dysphagia and rare GERD                            symptoms. Bothered by bloating now. Medicines:                Monitored Anesthesia Care Procedure:                Pre-Anesthesia Assessment:                           - Prior to the procedure, a History and Physical                            was performed, and patient medications and                            allergies were reviewed. The patient's tolerance of                            previous anesthesia was also reviewed. The risks                            and benefits of the procedure and the sedation                            options and risks were discussed with the patient.                            All questions were answered, and informed consent  was obtained. Prior Anticoagulants: The patient has                            taken no anticoagulant or antiplatelet agents. ASA                            Grade Assessment: II - A patient with mild systemic                            disease. After reviewing the risks and benefits,                            the patient was deemed in satisfactory condition to                             undergo the procedure.                           After obtaining informed consent, the endoscope was                            passed under direct vision. Throughout the                            procedure, the patient's blood pressure, pulse, and                            oxygen saturations were monitored continuously. The                            GIF [id number] #[id number] was introduced through the                            mouth, and advanced to the second part of duodenum.                            The upper GI endoscopy was accomplished without                            difficulty. The patient tolerated the procedure                            well. Scope In: Scope Out: Findings:                 The examined portions of the nasopharynx,                            oropharynx and larynx were normal.                           One benign-appearing, intrinsic mild stenosis was                            found in the lower third of the  esophagus. This                            stenosis measured 1.5 cm (inner diameter) x less                            than one cm (in length). The stenosis was traversed.                           The exam of the esophagus was otherwise normal.                            There were questionable subtle rings, but no                            furrows or exudate.                           Biopsies were obtained from the proximal and distal                            esophagus with cold forceps for histology of                            suspected eosinophilic esophagitis. Tug sign was                            negative. Estimated blood loss was minimal.                           A 4 cm hiatal hernia was present.                           The entire examined stomach was normal.                           The examined duodenum was normal. Complications:            No immediate complications. Estimated Blood Loss:     Estimated blood loss was  minimal. Impression:               - The examined portions of the nasopharynx,                            oropharynx and larynx were normal.                           - Benign-appearing esophageal stenosis. This was                            not dilated due to the wide patency and the                            resolution of dysphagia.                           -  4 cm hiatal hernia.                           - Normal stomach.                           - Normal examined duodenum.                           - Biopsies were taken with a cold forceps for                            evaluation of eosinophilic esophagitis. Suspect                            patient's dysphagia and eosinophilia in July were                            secondary to acid reflux, not EoE. Recommendation:           - Patient has a contact number available for                            emergencies. The signs and symptoms of potential                            delayed complications were discussed with the                            patient. Return to normal activities tomorrow.                            Written discharge instructions were provided to the                            patient.                           - Resume previous diet.                           - Continue present medications. Continue Protonix                            once daily.                           - Await pathology results. Further recommendations                            will be based on pathology results. Frank [name] E. Tomasa Rand, MD 12/01/2022 10:53:39 AM This report has been signed electronically.

## 2022-12-01 NOTE — Patient Instructions (Addendum)
Recommendation:           - Patient has a contact number available for                            emergencies. The signs and symptoms of potential                            delayed complications were discussed with the                            patient. Return to normal activities tomorrow.                            Written discharge instructions were provided to the                            patient.                           - Resume previous diet.                           - Continue present medications. Continue Protonix                            once daily.                           - Await pathology results. Further recommendations                            will be based on pathology results.  YOU HAD AN ENDOSCOPIC PROCEDURE TODAY AT THE [facility]:   Refer to the procedure report that was given to you for any specific questions about what was found during the examination.  If the procedure report does not answer your questions, please call your gastroenterologist to clarify.  If you requested that your care partner not be given the details of your procedure findings, then the procedure report has been included in a sealed envelope for you to review at your convenience later.  YOU SHOULD EXPECT: Some feelings of bloating in the abdomen. Passage of more gas than usual.  Walking can help get rid of the air that was put into your GI tract during the procedure and reduce the bloating. If you had a lower endoscopy (such as a colonoscopy or flexible sigmoidoscopy) you may notice spotting of blood in your stool or on the toilet paper. If you underwent a bowel prep for your procedure, you may not have a normal bowel movement for a few days.  Please Note:  You might notice some irritation and congestion in your nose or some drainage.  This is from the oxygen used during your procedure.  There is no need for concern and it should clear up in a day or so.  SYMPTOMS TO REPORT  IMMEDIATELY:  Following upper endoscopy (EGD)  Vomiting of blood or coffee ground material  New chest pain or pain under the shoulder blades  Painful or persistently difficult swallowing  New shortness of breath  Fever of 100F  or higher  Black, tarry-looking stools  For urgent or emergent issues, a gastroenterologist can be reached at any hour by calling [phone]. Do not use MyChart messaging for urgent concerns.    DIET:  We do recommend a small meal at first, but then you may proceed to your regular diet.  Drink plenty of fluids but you should avoid alcoholic beverages for 24 hours.  ACTIVITY:  You should plan to take it easy for the rest of today and you should NOT DRIVE or use heavy machinery until tomorrow (because of the sedation medicines used during the test).    FOLLOW UP: Our staff will call the number listed on your records the next business day following your procedure.  We will call around 7:15- 8:00 am to check on you and address any questions or concerns that you may have regarding the information given to you following your procedure. If we do not reach you, we will leave a message.     If any biopsies were taken you will be contacted by phone or by letter within the next 1-3 weeks.  Please call us at [phone] if you have not heard about the biopsies in 3 weeks.    SIGNATURES/CONFIDENTIALITY: You and/or your care partner have signed paperwork which will be entered into your electronic medical record.  These signatures attest to the fact that that the information above on your After Visit Summary has been reviewed and is understood.  Full responsibility of the confidentiality of this discharge information lies with you and/or your care-partner.

## 2022-12-01 NOTE — Progress Notes (Signed)
Gastroenterology History and Physical   Primary Care Physician:  Tally Joe, MD   Reason for Procedure:   Follow up esophagitis, dysphagia  Plan:    EGD with repeat esophageal biopsies, possible dilation     HPI: Frank [name] is a 52 y.o. male undergoing repeat EGD.  He had an EGD in July in the setting of an esophageal food impaction.  A distal stricture was dilated to 18 mm.  Endoscopic findings were more suggestive of reflux esophagitis and peptic stricture.  Biopsies showed elevated eosinophils in distal and proximal esophagus (more prominent in distal esophagus).  Today, he denies any problems with dysphagia and has had rare reflux episodes in the past 2 months.  He took Protonix BID for 8 weeks and is now taking it once daily.    Past Medical History:  Diagnosis Date   Allergic rhinitis    Asthma    Cat bite     Past Surgical History:  Procedure Laterality Date   BALLOON DILATION N/A 09/27/2022   Procedure: BALLOON DILATION;  Surgeon: Jenel Lucks, MD;  Location: [facility] ENDOSCOPY;  Service: Gastroenterology;  Laterality: N/A;   BIOPSY  09/27/2022   Procedure: BIOPSY;  Surgeon: Jenel Lucks, MD;  Location: [facility] ENDOSCOPY;  Service: Gastroenterology;;   ESOPHAGOGASTRODUODENOSCOPY (EGD) WITH PROPOFOL N/A 09/27/2022   Procedure: ESOPHAGOGASTRODUODENOSCOPY (EGD) WITH PROPOFOL;  Surgeon: Jenel Lucks, MD;  Location: [facility] ENDOSCOPY;  Service: Gastroenterology;  Laterality: N/A;   FOREIGN BODY REMOVAL ESOPHAGEAL  09/27/2022   Procedure: REMOVAL FOREIGN BODY ESOPHAGEAL;  Surgeon: Jenel Lucks, MD;  Location: [facility] ENDOSCOPY;  Service: Gastroenterology;;   none      Prior to Admission medications   Medication Sig Start Date End Date Taking? Authorizing Provider  albuterol (VENTOLIN HFA) 108 (90 Base) MCG/ACT inhaler Inhale 1-2 puffs into the lungs every 6 (six) hours as needed for wheezing or shortness of breath.   Yes [provider]   albuterol (VENTOLIN HFA) 108 (90 Base) MCG/ACT inhaler Inhale 1 puff by mouth into the lungs every 4 hours 11/22/21  Yes   cetirizine (ZYRTEC) 5 MG tablet Take 5 mg by mouth daily.   Yes [provider]  ibuprofen (ADVIL) 800 MG tablet Take 1 tablet (800 mg total) by mouth 3 (three) times daily. 09/28/21  Yes Ellsworth Lennox, PA-C  pantoprazole (PROTONIX) 40 MG tablet Take 1 tablet (40 mg total) by mouth 2 (two) times daily. 09/27/22 12/01/22 Yes Jenel Lucks, MD  calcium carbonate (TUMS - DOSED IN MG ELEMENTAL CALCIUM) 500 MG chewable tablet Chew 1 tablet by mouth daily.    [provider]  doxycycline (VIBRA-TABS) 100 MG tablet Take 1 tablet (100 mg total) by mouth 2 (two) times daily for 10 days Patient not taking: Reported on 12/01/2022 10/01/21   Lowanda Foster, NP  mupirocin ointment (BACTROBAN) 2 % Apply 1 application topically 2 (two) times daily for 5 days Patient not taking: Reported on 09/28/2021 06/20/21   Lowanda Foster, NP  trimethoprim-polymyxin b (POLYTRIM) ophthalmic solution Place 1-2 drops in both eyes 4 times a day. Patient not taking: Reported on 12/01/2022 02/02/22   Charlett Nose, MD    Current Outpatient Medications  Medication Sig Dispense Refill   albuterol (VENTOLIN HFA) 108 (90 Base) MCG/ACT inhaler Inhale 1-2 puffs into the lungs every 6 (six) hours as needed for wheezing or shortness of breath.     albuterol (VENTOLIN HFA) 108 (90 Base) MCG/ACT inhaler Inhale 1 puff by mouth  into the lungs every 4 hours 6.7 g 1   cetirizine (ZYRTEC) 5 MG tablet Take 5 mg by mouth daily.     ibuprofen (ADVIL) 800 MG tablet Take 1 tablet (800 mg total) by mouth 3 (three) times daily. 21 tablet 0   pantoprazole (PROTONIX) 40 MG tablet Take 1 tablet (40 mg total) by mouth 2 (two) times daily. 120 tablet 0   calcium carbonate (TUMS - DOSED IN MG ELEMENTAL CALCIUM) 500 MG chewable tablet Chew 1 tablet by mouth daily.     doxycycline (VIBRA-TABS) 100 MG tablet Take 1 tablet  (100 mg total) by mouth 2 (two) times daily for 10 days (Patient not taking: Reported on 12/01/2022) 20 tablet 0   mupirocin ointment (BACTROBAN) 2 % Apply 1 application topically 2 (two) times daily for 5 days (Patient not taking: Reported on 09/28/2021) 22 g 1   trimethoprim-polymyxin b (POLYTRIM) ophthalmic solution Place 1-2 drops in both eyes 4 times a day. (Patient not taking: Reported on 12/01/2022) 10 mL 1   Current Facility-Administered Medications  Medication Dose Route Frequency Provider Last Rate Last Admin   0.9 %  sodium chloride infusion  500 mL Intravenous Once Nandigam, Kavitha V, MD       0.9 %  sodium chloride infusion  500 mL Intravenous Once Jenel Lucks, MD        Allergies as of 12/01/2022 - Review Complete 12/01/2022  Allergen Reaction Noted   Peanut (diagnostic)  02/18/2021    Family History  Problem Relation Age of Onset   Multiple myeloma Father    Diabetes Father    Hypertension Father    Diabetes Maternal Grandmother    CVA Maternal Grandmother    Prostate cancer Maternal Grandfather    Cancer Paternal Grandfather    Colon cancer Neg Hx     Social History   Socioeconomic History   Marital status: Married    Spouse name: Not on file   Number of children: Not on file   Years of education: Not on file   Highest education level: Not on file  Occupational History   Not on file  Tobacco Use   Smoking status: Never   Smokeless tobacco: Never  Vaping Use   Vaping status: Never Used  Substance and Sexual Activity   Alcohol use: Yes   Drug use: No   Sexual activity: Not on file  Other Topics Concern   Not on file  Social History Narrative   Not on file   Social Determinants of Health   Financial Resource Strain: Not on file  Food Insecurity: Not on file  Transportation Needs: Not on file  Physical Activity: Not on file  Stress: Not on file  Social Connections: Not on file  Intimate Partner Violence: Not on file    Review of  Systems:  All other review of systems negative except as mentioned in the HPI.  Physical Exam: Vital signs BP 131/79   Pulse 76   Temp 99.1 F (37.3 C)   Ht 6\' 4"  (1.93 m)   Wt 212 lb (96.2 kg)   SpO2 94%   BMI 25.81 kg/m   General:   Alert,  Well-developed, well-nourished, pleasant and cooperative in NAD Airway:  Mallampati 2 Lungs:  Clear throughout to auscultation.   Heart:  Regular rate and rhythm; no murmurs, clicks, rubs,  or gallops. Abdomen:  Soft, nontender and nondistended. Normal bowel sounds.   Neuro/Psych:  Normal mood and affect. A and O x  3   Johnasia Liese E. Tomasa Rand, MD [facility] Gastroenterology

## 2022-12-01 NOTE — Progress Notes (Signed)
Called to room to assist during endoscopic procedure.  Patient ID and intended procedure confirmed with present staff. Received instructions for my participation in the procedure from the performing physician.  

## 2022-12-01 NOTE — Progress Notes (Signed)
Sedate, gd SR, tolerated procedure well, VSS, report to RN 

## 2022-12-04 ENCOUNTER — Telehealth: Payer: Self-pay | Admitting: *Deleted

## 2022-12-04 NOTE — Telephone Encounter (Signed)
  Follow up Call-     12/01/2022    9:28 AM  Call back number  Post procedure Call Back phone  # [phone]  Permission to leave phone message Yes     Patient questions:  Do you have a fever, pain , or abdominal swelling? No. Pain Score  0 *  Have you tolerated food without any problems? Yes.    Have you been able to return to your normal activities? Yes.    Do you have any questions about your discharge instructions: Diet   No. Medications  No. Follow up visit  No.  Do you have questions or concerns about your Care? No.  Actions: * If pain score is 4 or above: No action needed, pain <4.

## 2022-12-07 LAB — SURGICAL PATHOLOGY

## 2022-12-12 NOTE — Progress Notes (Signed)
Mr. Hession,  The biopsies of your esophagus showed resolution of the eosinophilic infiltration seen in July.  As discussed, I think your esophageal stricture and dysphagia symptoms are from acid reflux, not eosinophilic esophagitis.  Please continue to take the omeprazole daily to reduce the risk of developing a recurrent esophageal stricture.

## 2022-12-27 IMAGING — CT CT CARDIAC CORONARY ARTERY CALCIUM SCORE
3 series · 14 of 20 positions shown, 16 images · non-contrast
Comparison: 01/26/2009 chest radiograph.
COMPARISON: 01/26/2009 chest radiograph.

Addendum:
EXAM:
OVER-READ INTERPRETATION  CT CHEST

The following report is an over-read performed by radiologist Dr.
Rusna Sagor [REDACTED] on 03/25/2021. This over-read
does not include interpretation of cardiac or coronary anatomy or
pathology. The calcium score interpretation by the cardiologist is
attached.
CLINICAL DATA: Cardiovascular Disease Risk stratification
Coronary Calcium Score
TECHNIQUE: A gated, non-contrast computed tomography scan of the heart was
performed using 3mm slice thickness. Axial images were analyzed on a
dedicated workstation. Calcium scoring of the coronary arteries was
performed using the Agatston method.

[Series 2: ax lung · axial · 0.92mm/px · z∈[-92,+54]mm · 6 of 103 slices shown]
[im 15/103  lung]
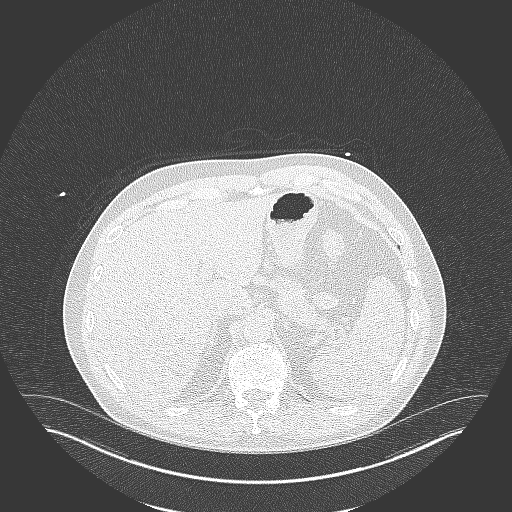
[im 30/103  lung]
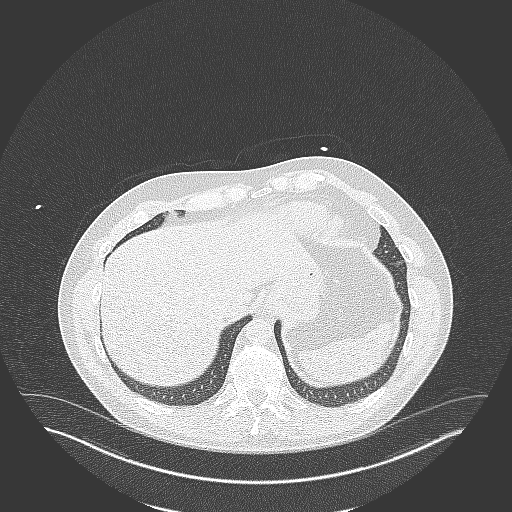
[im 44/103  lung]
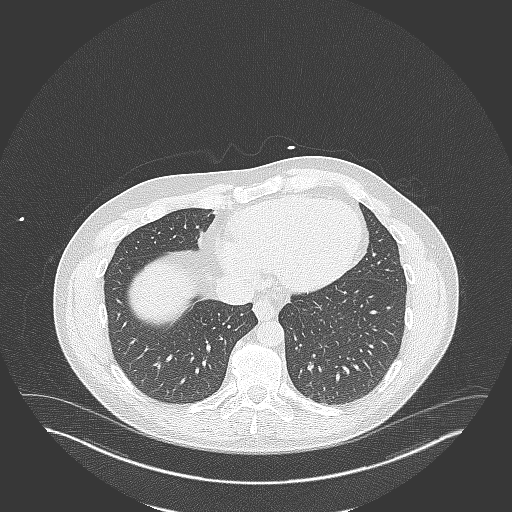
[im 59/103  lung]
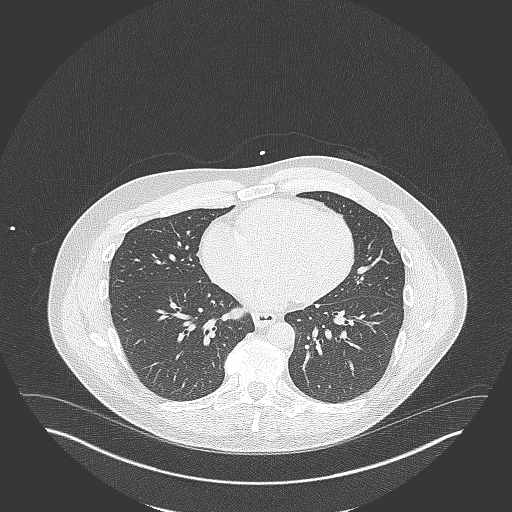
[im 73/103  lung]
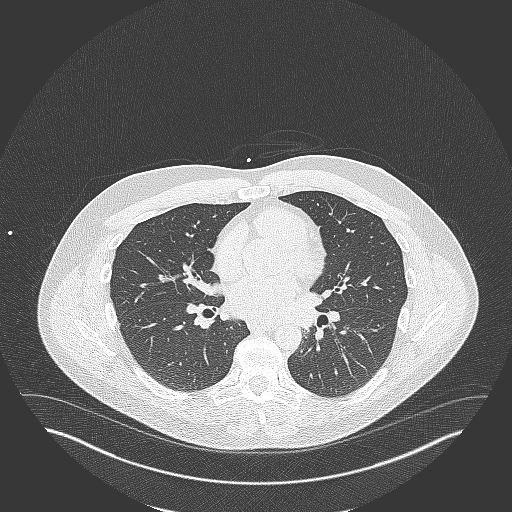
[im 88/103  lung]
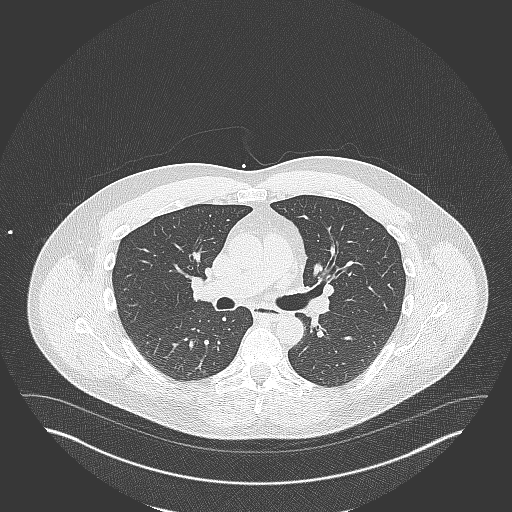

[Series 3: cascseq 3.0 sa36 70% (id) · axial · 0.40mm/px · z∈[-70,+32]mm · 3 of 69 slices shown]
[im 18/69  vessel]
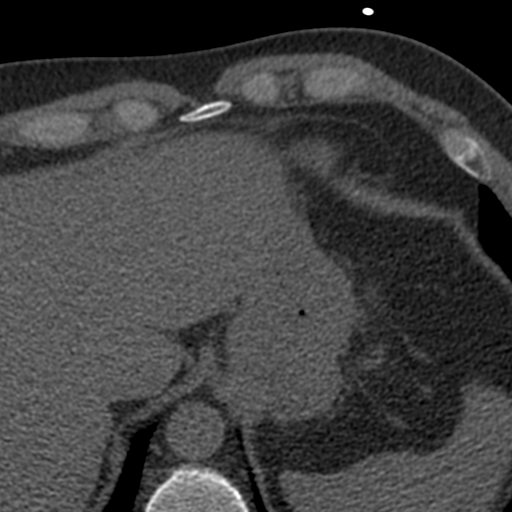
[im 35/69  vessel]
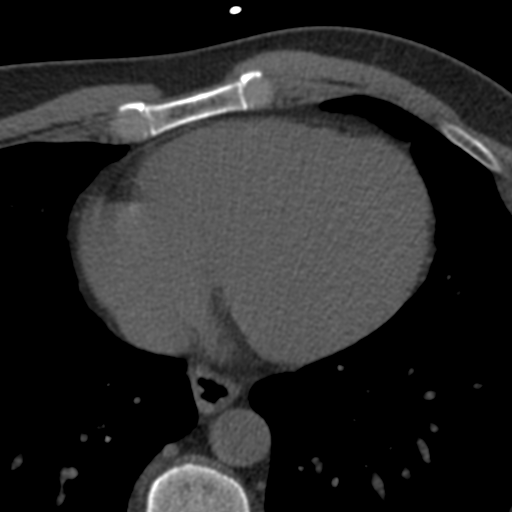
[im 52/69  vessel]
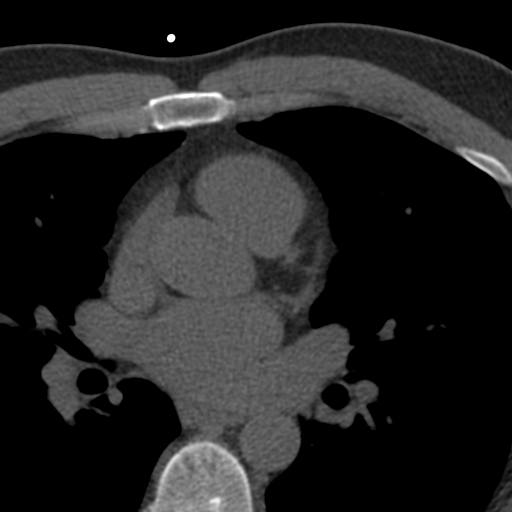

[Series 4: ax st · axial · 0.74mm/px · z∈[-86,+50]mm · 5 of 103 slices shown, 7 images]
[im 18/103  vessel]
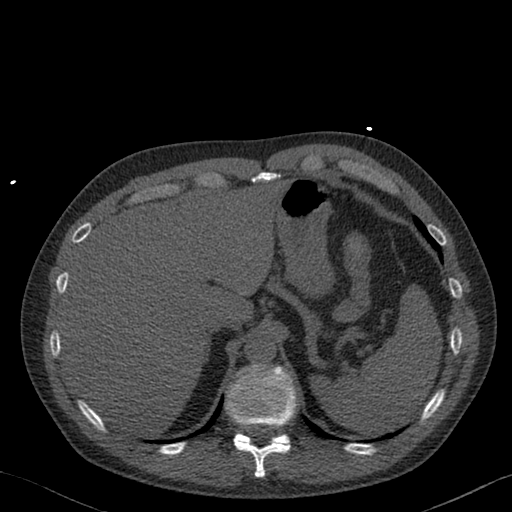
[im 18/103  lung]
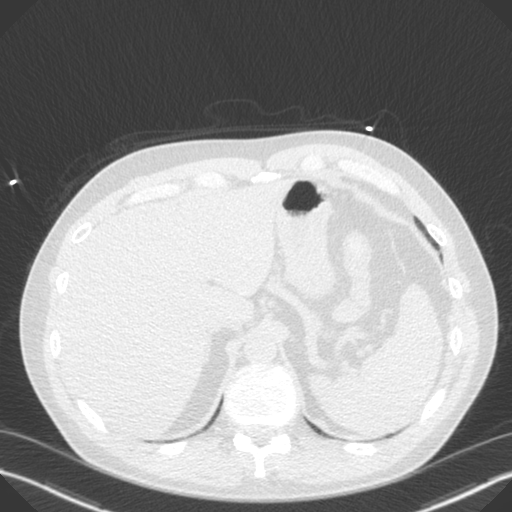
[im 35/103  vessel]
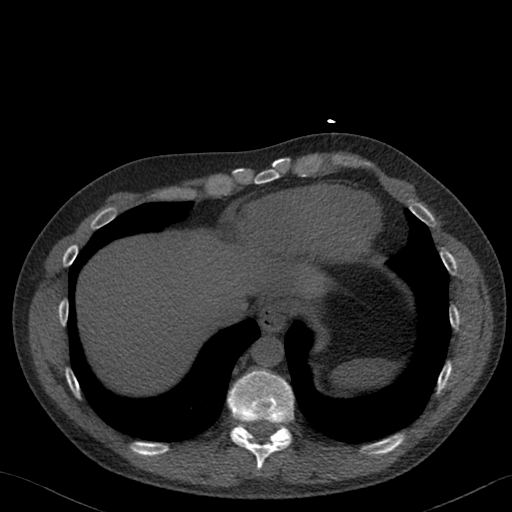
[im 52/103  vessel]
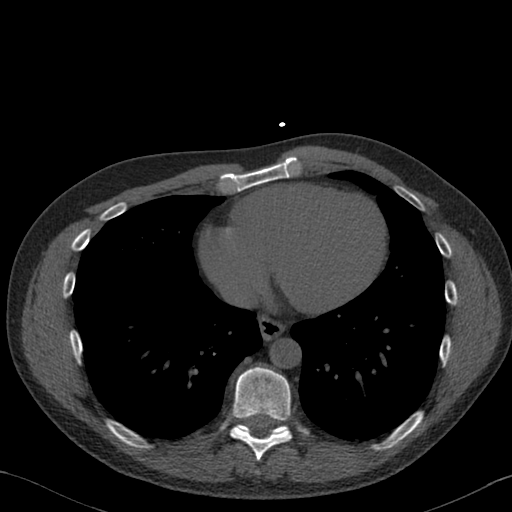
[im 69/103  vessel]
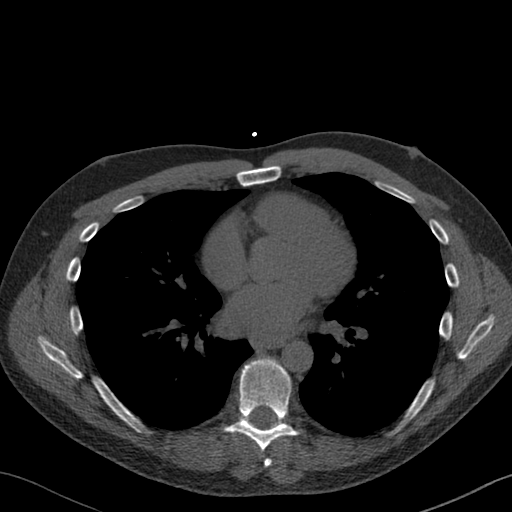
[im 86/103  vessel]
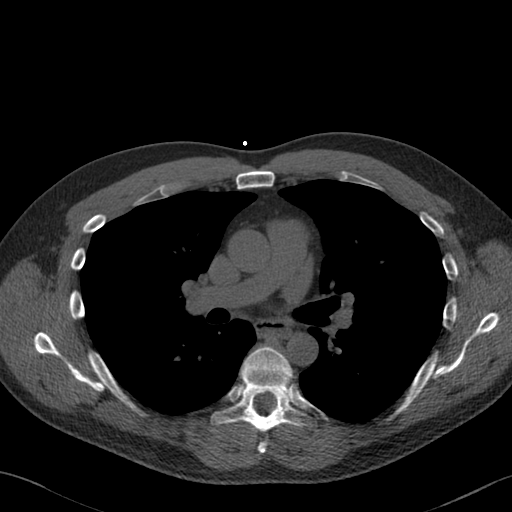
[im 86/103  lung]
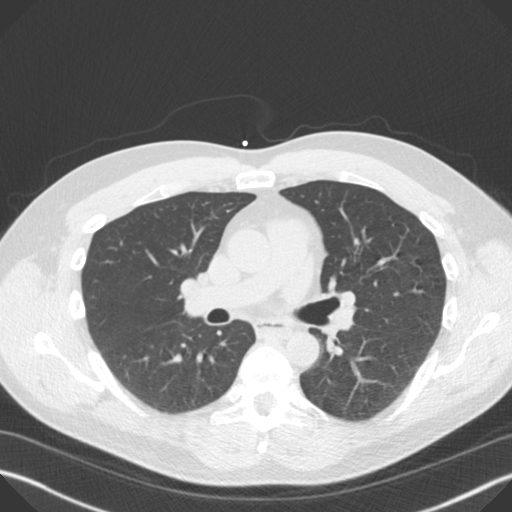

[14 of 20 positions shown; findings below may reference images not displayed]

FINDINGS: Vascular: Normal aortic caliber.

Mediastinum/Nodes: No imaged thoracic adenopathy.

Esophageal fluid level on [DATE].

Lungs/Pleura: No pleural fluid.  Clear imaged lungs.

Upper Abdomen: Normal imaged portions of the liver, spleen, stomach,
pancreas, gallbladder, adrenal glands, right kidney.

Musculoskeletal: No acute osseous abnormality.
IMPRESSION: 1.  No acute findings in the imaged extracardiac chest.
2. Esophageal air fluid level suggests dysmotility or
gastroesophageal reflux.
FINDINGS: Coronary arteries: Normal origins.

Coronary Calcium Score:

Total: 0

Percentile: 0

Pericardium: Normal.

Ascending Aorta: Normal caliber.

Non-cardiac: See separate report from [REDACTED].
IMPRESSION: Coronary calcium score of 0.



If CAC=0, it is reasonable to withhold statin therapy and reassess
in 5 to 10 years, as long as higher risk conditions are absent
(diabetes mellitus, family history of premature CHD in first degree
relatives (males <55 years; females <65 years), cigarette smoking,
or LDL >=190 mg/dL).

If CAC is 1 to 99, it is reasonable to initiate statin therapy for
patients >=55 years of age.

If CAC is >=100 or >=75th percentile, it is reasonable to initiate
statin therapy at any age.

Cardiology referral should be considered for patients with CAC
scores >=400 or >=75th percentile.

*9271 AHA/ACC/AACVPR/AAPA/ABC/CUMBA CINTRON/NATHALIEKE/BAME/Daviid/MANZANO/ERXLEBEN/FRIJLINK
Guideline on the Management of Blood Cholesterol: A Report of the
American College of Cardiology/American Heart Association Task Force
on Clinical Practice Guidelines. J Am Coll Cardiol.
9438;73(24):1653-1743.

*** End of Addendum ***
EXAM:
OVER-READ INTERPRETATION  CT CHEST

The following report is an over-read performed by radiologist Dr.
Rusna Sagor [REDACTED] on 03/25/2021. This over-read
does not include interpretation of cardiac or coronary anatomy or
pathology. The calcium score interpretation by the cardiologist is
attached.
FINDINGS: Vascular: Normal aortic caliber.

Mediastinum/Nodes: No imaged thoracic adenopathy.

Esophageal fluid level on [DATE].

Lungs/Pleura: No pleural fluid.  Clear imaged lungs.

Upper Abdomen: Normal imaged portions of the liver, spleen, stomach,
pancreas, gallbladder, adrenal glands, right kidney.

Musculoskeletal: No acute osseous abnormality.
IMPRESSION: 1.  No acute findings in the imaged extracardiac chest.
2. Esophageal air fluid level suggests dysmotility or
gastroesophageal reflux.

## 2023-01-03 ENCOUNTER — Other Ambulatory Visit (HOSPITAL_COMMUNITY): Payer: Self-pay

## 2023-01-03 ENCOUNTER — Other Ambulatory Visit: Payer: Self-pay

## 2023-01-03 ENCOUNTER — Other Ambulatory Visit: Payer: Self-pay | Admitting: Gastroenterology

## 2023-01-03 MED ORDER — PANTOPRAZOLE SODIUM 40 MG PO TBEC
40.0000 mg | DELAYED_RELEASE_TABLET | Freq: Two times a day (BID) | ORAL | 0 refills | Status: DC
  Filled 2023-01-03: qty 120, 60d supply, fill #0

## 2023-01-04 ENCOUNTER — Other Ambulatory Visit (HOSPITAL_COMMUNITY): Payer: Self-pay

## 2023-01-04 MED ORDER — ALBUTEROL SULFATE HFA 108 (90 BASE) MCG/ACT IN AERS
INHALATION_SPRAY | RESPIRATORY_TRACT | 0 refills | Status: DC
  Filled 2023-01-04: qty 6.7, 20d supply, fill #0

## 2023-06-05 ENCOUNTER — Other Ambulatory Visit: Payer: Self-pay | Admitting: Gastroenterology

## 2023-06-05 ENCOUNTER — Other Ambulatory Visit (HOSPITAL_COMMUNITY): Payer: Self-pay

## 2023-06-07 ENCOUNTER — Other Ambulatory Visit (HOSPITAL_COMMUNITY): Payer: Self-pay

## 2023-06-07 ENCOUNTER — Telehealth: Payer: Self-pay | Admitting: Gastroenterology

## 2023-06-07 ENCOUNTER — Other Ambulatory Visit: Payer: Self-pay

## 2023-06-07 MED ORDER — PANTOPRAZOLE SODIUM 40 MG PO TBEC
40.0000 mg | DELAYED_RELEASE_TABLET | Freq: Every day | ORAL | 1 refills | Status: DC
Start: 2023-06-07 — End: 2023-08-15
  Filled 2023-06-07 – 2023-06-08 (×2): qty 30, 30d supply, fill #0
  Filled 2023-07-11: qty 30, 30d supply, fill #1

## 2023-06-07 NOTE — Telephone Encounter (Signed)
 That needs to go the right person

## 2023-06-07 NOTE — Telephone Encounter (Signed)
 Refill sent to patients pharmacy.

## 2023-06-07 NOTE — Telephone Encounter (Signed)
Patient called to request refill on Pantoprazole 

## 2023-06-08 ENCOUNTER — Other Ambulatory Visit (HOSPITAL_COMMUNITY): Payer: Self-pay

## 2023-06-08 MED ORDER — AMOXICILLIN-POT CLAVULANATE 875-125 MG PO TABS
1.0000 | ORAL_TABLET | Freq: Two times a day (BID) | ORAL | 0 refills | Status: DC
  Filled 2023-06-08: qty 28, 14d supply, fill #0

## 2023-07-11 ENCOUNTER — Other Ambulatory Visit (HOSPITAL_COMMUNITY): Payer: Self-pay

## 2023-08-15 ENCOUNTER — Other Ambulatory Visit: Payer: Self-pay | Admitting: Gastroenterology

## 2023-08-15 ENCOUNTER — Other Ambulatory Visit (HOSPITAL_COMMUNITY): Payer: Self-pay

## 2023-08-15 MED ORDER — PANTOPRAZOLE SODIUM 40 MG PO TBEC
40.0000 mg | DELAYED_RELEASE_TABLET | Freq: Every day | ORAL | 1 refills | Status: DC
  Filled 2023-08-15: qty 30, 30d supply, fill #0
  Filled 2023-09-10: qty 30, 30d supply, fill #1

## 2023-08-20 ENCOUNTER — Other Ambulatory Visit (HOSPITAL_COMMUNITY): Payer: Self-pay

## 2023-08-20 MED ORDER — VIVOTIF PO CPDR
1.0000 | DELAYED_RELEASE_CAPSULE | ORAL | 0 refills | Status: AC
  Filled 2023-08-20: qty 4, 8d supply, fill #0

## 2023-08-20 MED ORDER — EPINEPHRINE 0.3 MG/0.3ML IJ SOAJ
INTRAMUSCULAR | 0 refills | Status: AC
  Filled 2023-08-20: qty 2, 30d supply, fill #0

## 2023-08-22 ENCOUNTER — Other Ambulatory Visit (HOSPITAL_COMMUNITY): Payer: Self-pay

## 2023-09-10 ENCOUNTER — Other Ambulatory Visit (HOSPITAL_COMMUNITY): Payer: Self-pay

## 2023-09-10 MED ORDER — ALBUTEROL SULFATE HFA 108 (90 BASE) MCG/ACT IN AERS
1.0000 | INHALATION_SPRAY | RESPIRATORY_TRACT | 0 refills | Status: DC
  Filled 2023-09-10: qty 6.7, 30d supply, fill #0

## 2023-09-11 ENCOUNTER — Other Ambulatory Visit (HOSPITAL_COMMUNITY): Payer: Self-pay

## 2023-09-11 ENCOUNTER — Other Ambulatory Visit: Payer: Self-pay

## 2023-09-12 ENCOUNTER — Other Ambulatory Visit (HOSPITAL_COMMUNITY): Payer: Self-pay

## 2023-09-13 ENCOUNTER — Ambulatory Visit (INDEPENDENT_AMBULATORY_CARE_PROVIDER_SITE_OTHER): Payer: PRIVATE HEALTH INSURANCE | Admitting: Gastroenterology

## 2023-09-13 ENCOUNTER — Other Ambulatory Visit

## 2023-09-13 ENCOUNTER — Encounter: Payer: Self-pay | Admitting: Gastroenterology

## 2023-09-13 VITALS — BP 122/78 | HR 71 | Ht 76.0 in | Wt 219.0 lb

## 2023-09-13 DIAGNOSIS — K449 Diaphragmatic hernia without obstruction or gangrene: Secondary | ICD-10-CM

## 2023-09-13 DIAGNOSIS — R140 Abdominal distension (gaseous): Secondary | ICD-10-CM | POA: Diagnosis not present

## 2023-09-13 DIAGNOSIS — K2100 Gastro-esophageal reflux disease with esophagitis, without bleeding: Secondary | ICD-10-CM

## 2023-09-13 DIAGNOSIS — K219 Gastro-esophageal reflux disease without esophagitis: Secondary | ICD-10-CM

## 2023-09-13 NOTE — Patient Instructions (Signed)
 Your provider has requested that you go to the basement level for lab work before leaving today. Press B on the elevator. The lab is located at the first door on the left as you exit the elevator.  Your provider has ordered Diatherix stool testing for you. You have received a kit from our office today containing all necessary supplies to complete this test. Please carefully read the stool collection instructions provided in the kit before opening the accompanying materials. In addition, be sure there is a label providing your full name and date of birth on the puritan opti-swab tube that is supplied in the kit (if you do not see a label with this information on your test tube, please make us  aware before test collection!). After completing the test, you should secure the purtian tube into the specimen biohazard bag. The [facility] E-Req sheet (including date and time of specimen collection) should be placed into the outside pocket of the specimen biohazard bag and returned to the [place] lab (basement floor of [facility]) within 3 days of collection. Please make sure to give the specimen to a staff member at the lab. DO NOT leave the specimen on the counter.   If the specimen date and time (can be found in the upper right boxed portion of the sheet) are not filled out on the E-Req sheet, the test will NOT be performed.

## 2023-09-13 NOTE — Progress Notes (Signed)
 Discussed the use of AI scribe software for clinical note transcription with the patient, who gave verbal consent to proceed.  HPI : OTHA MONICAL is a 53 year old male with a history of esophageal food impaction secondary to peptic stricture who presents for follow up to discuss symptoms he thinks are secondary to his hiatal hernia.    He experienced a food impaction in July and had inflammatory changes in the esophagus concerning for EoE vs reflux esophagitis. Eosinophils were elevated.  He was treated with BID PPI and a repeat EGD in September showed resolution of inflammatory changes, no eosinophilia.  Today, he says he experiences soreness in the abdominal area during physical activities, particularly when lifting heavy weights. This discomfort started a few weeks ago and is most pronounced during exercises like bench presses, flies, and curls. As a result, he has switched to using resistance bands and lighter weights. He also notices discomfort when working in the yard as well.  He wonders if this pain is related to his hiatal hernia.  His reflux symptoms are well controlled with pantoprazole  40 mg once daily. He is trying to improve his diet but finds it difficult to limit coffee intake. No current issues with swallowing or reflux are reported.  He experiences frequent bloating, which he associates with certain foods. This has been an issue for several years. Regular bowel movements are reported, but he feels he does not go as often as he should, contributing to his discomfort.  He is active and engages in weightlifting and yard work. There is a possible family history of osteoporosis in his mother.      EGD December 01, 2022 Mild stenosis found in the lower third of esophagus, estimate 1.5 cm in diameter The esophagus was otherwise normal.  There were questionable subtle rings, but no furrows or exudate, biopsied 4 cm hiatal hernia Suspect patient's dysphagia and eosinophilia are  secondary to reflux, not EOE  FINAL DIAGNOSIS       1. Surgical [P], distal esophagus :      - SQUAMOUS MUCOSA WITH SPONGIOSIS, MILD REACTIVE CHANGES, AND RARE      INTRAEPITHELIAL EOSINOPHILS (UP TO 1 EOSINOPHIL IN MULTIPLE HIGH POWER FIELDS).      - NEGATIVE FOR DYSPLASIA AND MALIGNANCY.      - SEE NOTE       2. Surgical [P], proximal esophagus :      - SQUAMOUS MUCOSA WITH SPONGIOSIS, MILD REACTIVE CHANGES, AND FOCAL GLYCOGEN      ACANTHOSIS.      - NEGATIVE FOR INTRAEPITHELIAL EOSINOPHILS, DYSPLASIA, AND MALIGNANCY.      - SEE NOTE       Diagnosis Note : The results of the patient's prior eosphageal biopsy in July      2024 are noted.On endoscopy, glycogen acanthosis can appear as a white colored      plaque and represents a mild increase in glycogen within the squamous      cells.Glycogen acanthosis is a non-specific finding but can mimic some of the      endoscopic findings of eosinophilic esophagitis.The differential diagnosis for      the findings in this case include reflux esophagitis and treated eosinophilic      esophagitis.Correlation with clinical impression and endoscopic findings is      required.   EGD September 27, 2022 Indication: Food impaction Impression:                - Food in  the lower third of the esophagus. Removal was successful. - Mildly severe esophagitis with no bleeding. - Benign-appearing esophageal stenosis. Dilated. - 3 cm hiatal hernia. - Normal examined duodenum. - Biopsies were taken with a cold forceps for evaluation of eosinophilic esophagitis. - Endoscopic findings are most consistent with GERD with peptic stricture. EoE unlikely.  FINAL MICROSCOPIC DIAGNOSIS:  A. DISTAL ESOPHAGUS, BIOPSY: Eosinophil-rich esophagitis (see comment) Negative for glandular epithelium, dysplasia and carcinoma  B. PROXIMAL ESOPHAGUS, BIOPSY: Eosinophil-rich esophagitis (see comment) Negative for glandular epithelium, dysplasia and  carcinoma  COMMENT:  Both the distal and proximal esophageal biopsies show multiple fragments of reactive squamous mucosa.  Within both biopsies these fragments exhibit a patchy focal increased mononuclear cell infiltrate comprised predominantly of eosinophils.  Focally the eosinophils number up to 20 within a high-power field.  The distal esophageal biopsy also shows a very focal neutrophilic infiltrate. These findings are compatible with an eosinophil-rich esophagitis the differential diagnosis of which would include reflux esophagitis and true eosinophilic esophagitis.  Clinical and endoscopic correlation is recommended.   Past Medical History:  Diagnosis Date   Allergic rhinitis    Asthma    Cat bite      Past Surgical History:  Procedure Laterality Date   BALLOON DILATION N/A 09/27/2022   Procedure: BALLOON DILATION;  Surgeon: Stacia Glendia BRAVO, MD;  Location: [facility] ENDOSCOPY;  Service: Gastroenterology;  Laterality: N/A;   BIOPSY  09/27/2022   Procedure: BIOPSY;  Surgeon: Stacia Glendia BRAVO, MD;  Location: [facility] ENDOSCOPY;  Service: Gastroenterology;;   ESOPHAGOGASTRODUODENOSCOPY (EGD) WITH PROPOFOL  N/A 09/27/2022   Procedure: ESOPHAGOGASTRODUODENOSCOPY (EGD) WITH PROPOFOL ;  Surgeon: Stacia Glendia BRAVO, MD;  Location: [facility] ENDOSCOPY;  Service: Gastroenterology;  Laterality: N/A;   FOREIGN BODY REMOVAL ESOPHAGEAL  09/27/2022   Procedure: REMOVAL FOREIGN BODY ESOPHAGEAL;  Surgeon: Stacia Glendia BRAVO, MD;  Location: [facility] ENDOSCOPY;  Service: Gastroenterology;;   none     Family History  Problem Relation Age of Onset   Multiple myeloma Father    Diabetes Father    Hypertension Father    Diabetes Maternal Grandmother    CVA Maternal Grandmother    Prostate cancer Maternal Grandfather    Cancer Paternal Grandfather    Colon cancer Neg Hx    Social History   Tobacco Use   Smoking status: Never   Smokeless tobacco: Never  Vaping Use   Vaping status: Never Used  Substance Use  Topics   Alcohol use: Yes   Drug use: No   Current Outpatient Medications  Medication Sig Dispense Refill   albuterol  (VENTOLIN  HFA) 108 (90 Base) MCG/ACT inhaler Inhale 1-2 puffs into the lungs every 6 (six) hours as needed for wheezing or shortness of breath.     albuterol  (VENTOLIN  HFA) 108 (90 Base) MCG/ACT inhaler Inhale 1 puff into the lungs every 4 (four) hours. 6.7 g 0   amoxicillin -clavulanate (AUGMENTIN ) 875-125 MG tablet Take 1 tablet by mouth 2 (two) times daily for 14 days 28 tablet 0   calcium carbonate (TUMS - DOSED IN MG ELEMENTAL CALCIUM) 500 MG chewable tablet Chew 1 tablet by mouth daily.     cetirizine (ZYRTEC) 5 MG tablet Take 5 mg by mouth daily.     doxycycline  (VIBRA -TABS) 100 MG tablet Take 1 tablet (100 mg total) by mouth 2 (two) times daily for 10 days (Patient not taking: Reported on 12/01/2022) 20 tablet 0   EPINEPHrine  0.3 mg/0.3 mL IJ SOAJ injection Use as directed for injection once, use second injection as needed  for allergic reaction. 2 each 0   ibuprofen  (ADVIL ) 800 MG tablet Take 1 tablet (800 mg total) by mouth 3 (three) times daily. 21 tablet 0   mupirocin  ointment (BACTROBAN ) 2 % Apply 1 application topically 2 (two) times daily for 5 days (Patient not taking: Reported on 09/28/2021) 22 g 1   pantoprazole  (PROTONIX ) 40 MG tablet Take 1 tablet (40 mg total) by mouth daily. 30 tablet 1   trimethoprim -polymyxin b  (POLYTRIM ) ophthalmic solution Place 1-2 drops in both eyes 4 times a day. (Patient not taking: Reported on 12/01/2022) 10 mL 1   typhoid (VIVOTIF ) DR capsule Take 1 capsule by mouth every other day 1 hour before meals for 4 doses (8 days) 4 capsule 0   Current Facility-Administered Medications  Medication Dose Route Frequency Provider Last Rate Last Admin   0.9 %  sodium chloride  infusion  500 mL Intravenous Once Adelayde Minney E, MD       Allergies  Allergen Reactions   Peanut (Diagnostic)     Other reaction(s): hives     Review of  Systems: All systems reviewed and negative except where noted in HPI.    No results found.  Physical Exam: BP 122/78   Pulse 71   Ht 6' 4 (1.93 m)   Wt 219 lb (99.3 kg)   BMI 26.66 kg/m  Constitutional: Pleasant,well-developed, Caucasian male in no acute distress. HEENT: Normocephalic and atraumatic. Conjunctivae are normal. No scleral icterus. Neck supple.  Cardiovascular: Normal rate, regular rhythm.  Pulmonary/chest: Effort normal and breath sounds normal. No wheezing, rales or rhonchi. Abdominal: Soft, nondistended, mild tenderness to palpation in the epigastrium.  Carnett's sign negative.  No hernia or bulge noted. Bowel sounds active throughout. There are no masses palpable. No hepatomegaly. Extremities: no edema Neurological: Alert and oriented to person place and time. Skin: Skin is warm and dry. No rashes noted. Psychiatric: Normal mood and affect. Behavior is normal.  CBC    Component Value Date/Time   WBC 7.2 09/27/2022 1430   RBC 5.29 09/27/2022 1430   HGB 17.5 (H) 09/27/2022 1430   HCT 49.1 09/27/2022 1430   PLT 208 09/27/2022 1430   MCV 92.8 09/27/2022 1430   MCH 33.1 09/27/2022 1430   MCHC 35.6 09/27/2022 1430   RDW 12.4 09/27/2022 1430   LYMPHSABS 1.1 09/28/2021 0917   MONOABS 0.8 09/28/2021 0917   EOSABS 0.2 09/28/2021 0917   BASOSABS 0.0 09/28/2021 0917    CMP     Component Value Date/Time   NA 139 09/27/2022 1430   K 3.9 09/27/2022 1430   CL 103 09/27/2022 1430   CO2 23 09/27/2022 1430   GLUCOSE 114 (H) 09/27/2022 1430   BUN 13 09/27/2022 1430   CREATININE 1.15 09/27/2022 1430   CALCIUM 9.6 09/27/2022 1430   PROT 7.5 09/27/2022 1430   ALBUMIN 4.4 09/27/2022 1430   AST 37 09/27/2022 1430   ALT 46 (H) 09/27/2022 1430   ALKPHOS 62 09/27/2022 1430   BILITOT 0.6 09/27/2022 1430   GFRNONAA >60 09/27/2022 1430       Latest Ref Rng & Units 09/27/2022    2:30 PM 09/28/2021    9:17 AM  CBC EXTENDED  WBC 4.0 - 10.5 K/uL 7.2  7.8   RBC 4.22 -  5.81 MIL/uL 5.29  4.71   Hemoglobin 13.0 - 17.0 g/dL 82.4  84.7   HCT 60.9 - 52.0 % 49.1  43.2   Platelets 150 - 400 K/uL 208  133   NEUT#  1.7 - 7.7 K/uL  5.7   Lymph# 0.7 - 4.0 K/uL  1.1       ASSESSMENT AND PLAN:  53 year old male with GERD complicated by esophagitis/peptic stricture, with history of esophageal food impaction, currently with well controlled GERD symptoms and no dysphagia on once daily Protonix , with chronic bloating and abdominal pain with physical activity.  Gastroesophageal reflux disease (GERD) GERD symptoms controlled with pantoprazole  and diet. I have reviewed the indications, risks, and benefits of PPI therapy with the patient today. I have discussed studies that suggest an increased risk of osteoporosis/pathologic fracture and kidney disease.  I also discussed studies suggesting other potential adverse consequences such as increased risk of dementia, CAD and CVA and explained that these studies show very weak associations of unclear significance and not clear cause and effect. We did discuss the potential for vitamin malabsorption, to include magnesium (very rare), calcium (easily modifiable with Calcium Citrate supplement), vitamin B12 (again, correctable with oral B12 supplement), and iron (although rarely clinically significant outside patients who require iron supplementation previously), and can monitor each of these periodically with routine labs. Given his history of peptic stricture and clinical improvement in GERD symptoms, the benefits of PPI therapy greatly outweigh the risks - Continue pantoprazole  40 mg once daily. - Consider reducing to pantoprazole  20 mg if symptoms remain controlled in a few months. - Monitor kidney function periodically.  Hiatal hernia Hiatal hernia present, asymptomatic. Discomfort during weightlifting unlikely related. Surgery not indicated as GERD is controlled. Patient queried whether he should stop lifting weights, as this may  worsen his hernia.  I recommended that health benefits of weightlifting likely outweigh the low risk of worsening his hiatal hernia. - Continue current exercise regimen, including weightlifting. - Monitor for changes in symptoms, especially GERD or swallowing difficulties.  Bloating Chronic bloating possibly related to diet or gut flora. Differential includes dietary intolerance or gut flora imbalance. - Order H. pylori stool test ([facility]). - Order celiac disease blood test (tTG and IgA). - Provide low FODMAP diet handout. - Consider dietary eliminations to identify intolerances.  Recording duration: 18 minutes     Tyia Binford E. Stacia, MD [facility]    Seabron Lenis, MD

## 2023-09-14 LAB — TISSUE TRANSGLUTAMINASE, IGA: (tTG) Ab, IgA: <1 U/mL

## 2023-09-14 LAB — IGA: Immunoglobulin A: 117 mg/dL (ref 47–310)

## 2023-09-18 ENCOUNTER — Ambulatory Visit: Payer: Self-pay | Admitting: Gastroenterology

## 2023-09-18 NOTE — Progress Notes (Signed)
 Frank [name],  Your tests for celiac disease were negative (normal).  Will await stool test results for H. Pylori.

## 2023-09-27 ENCOUNTER — Telehealth: Payer: Self-pay | Admitting: Gastroenterology

## 2023-10-17 ENCOUNTER — Other Ambulatory Visit (HOSPITAL_BASED_OUTPATIENT_CLINIC_OR_DEPARTMENT_OTHER): Payer: Self-pay

## 2023-10-17 ENCOUNTER — Other Ambulatory Visit (HOSPITAL_COMMUNITY): Payer: Self-pay

## 2023-10-17 ENCOUNTER — Other Ambulatory Visit: Payer: Self-pay | Admitting: Gastroenterology

## 2023-10-17 ENCOUNTER — Telehealth: Payer: Self-pay | Admitting: Gastroenterology

## 2023-10-17 MED ORDER — PANTOPRAZOLE SODIUM 40 MG PO TBEC
40.0000 mg | DELAYED_RELEASE_TABLET | Freq: Every day | ORAL | 1 refills | Status: DC
  Filled 2023-10-17: qty 30, 30d supply, fill #0

## 2023-10-17 NOTE — Telephone Encounter (Signed)
 Inbound call from patient requesting a refill for Pantoprazole . Requesting prescription be sent to [facility] pharmacy. Please advise.

## 2023-10-17 NOTE — Telephone Encounter (Signed)
 Pantoprazole  has been sent to patient's pharmacy.

## 2023-11-30 ENCOUNTER — Telehealth: Payer: Self-pay | Admitting: Gastroenterology

## 2023-11-30 ENCOUNTER — Other Ambulatory Visit (HOSPITAL_COMMUNITY): Payer: Self-pay

## 2023-11-30 MED ORDER — PANTOPRAZOLE SODIUM 40 MG PO TBEC
40.0000 mg | DELAYED_RELEASE_TABLET | Freq: Every day | ORAL | 7 refills | Status: AC
  Filled 2023-11-30 – 2023-12-03 (×2): qty 30, 30d supply, fill #0
  Filled 2024-01-19: qty 30, 30d supply, fill #1
  Filled 2024-02-27: qty 30, 30d supply, fill #2
  Filled 2024-02-27: qty 30, 30d supply, fill #0
  Filled 2024-04-11 – 2024-04-12 (×2): qty 30, 30d supply, fill #1

## 2023-11-30 NOTE — Telephone Encounter (Signed)
 Inbound call from patient requesting refill for Protonix . Requesting a call back to discuss if he can get more than one refill due to having to call every moth to get th refill. Please advise.

## 2023-11-30 NOTE — Telephone Encounter (Signed)
 Prescription sent to patient's pharmacy.

## 2023-12-03 ENCOUNTER — Other Ambulatory Visit (HOSPITAL_COMMUNITY): Payer: Self-pay

## 2024-01-21 ENCOUNTER — Other Ambulatory Visit (HOSPITAL_COMMUNITY): Payer: Self-pay

## 2024-02-27 ENCOUNTER — Other Ambulatory Visit (HOSPITAL_COMMUNITY): Payer: Self-pay

## 2024-03-14 ENCOUNTER — Other Ambulatory Visit (HOSPITAL_COMMUNITY): Payer: Self-pay

## 2024-03-14 MED ORDER — ALBUTEROL SULFATE HFA 108 (90 BASE) MCG/ACT IN AERS: 1.0000 | INHALATION_SPRAY | RESPIRATORY_TRACT | 0 refills | Status: AC

## 2024-03-14 MED ORDER — ALBUTEROL SULFATE HFA 108 (90 BASE) MCG/ACT IN AERS
1.0000 | INHALATION_SPRAY | RESPIRATORY_TRACT | 0 refills | Status: AC
  Filled 2024-03-14: qty 6.7, 30d supply, fill #0

## 2024-04-12 ENCOUNTER — Other Ambulatory Visit (HOSPITAL_COMMUNITY): Payer: Self-pay

## 2024-04-17 ENCOUNTER — Other Ambulatory Visit (HOSPITAL_COMMUNITY): Payer: Self-pay

## 2024-04-17 MED ORDER — PANTOPRAZOLE SODIUM 20 MG PO TBEC
20.0000 mg | DELAYED_RELEASE_TABLET | Freq: Every day | ORAL | 1 refills | Status: AC
  Filled 2024-04-17: qty 30, 30d supply, fill #0
